# Patient Record
Sex: Female | Born: 1935
Health system: Southern US, Community
[De-identification: ages and names within clinical notes are randomized; demographics above are authoritative.]

## PROBLEM LIST (undated history)

## (undated) DIAGNOSIS — E7800 Pure hypercholesterolemia, unspecified: Secondary | ICD-10-CM

## (undated) DIAGNOSIS — I10 Essential (primary) hypertension: Secondary | ICD-10-CM

## (undated) DIAGNOSIS — M810 Age-related osteoporosis without current pathological fracture: Secondary | ICD-10-CM

## (undated) DIAGNOSIS — M1990 Unspecified osteoarthritis, unspecified site: Secondary | ICD-10-CM

## (undated) HISTORY — DX: Gilbert syndrome: E80.4

## (undated) HISTORY — PX: PARATHYROIDECTOMY: SHX19

---

## 2014-03-17 ENCOUNTER — Emergency Department (HOSPITAL_COMMUNITY)
Admission: EM | Admit: 2014-03-17 | Discharge: 2014-03-17 | Disposition: A | Discharge status: Home / Self Care | Payer: Medicare Other | Attending: Emergency Medicine | Admitting: Emergency Medicine

## 2014-03-17 ENCOUNTER — Encounter (HOSPITAL_COMMUNITY): Payer: Self-pay | Admitting: Emergency Medicine

## 2014-03-17 DIAGNOSIS — I10 Essential (primary) hypertension: Secondary | ICD-10-CM | POA: Diagnosis not present

## 2014-03-17 DIAGNOSIS — Z862 Personal history of diseases of the blood and blood-forming organs and certain disorders involving the immune mechanism: Secondary | ICD-10-CM | POA: Insufficient documentation

## 2014-03-17 DIAGNOSIS — Z8739 Personal history of other diseases of the musculoskeletal system and connective tissue: Secondary | ICD-10-CM | POA: Diagnosis not present

## 2014-03-17 DIAGNOSIS — I83893 Varicose veins of bilateral lower extremities with other complications: Secondary | ICD-10-CM | POA: Diagnosis not present

## 2014-03-17 DIAGNOSIS — Z8639 Personal history of other endocrine, nutritional and metabolic disease: Secondary | ICD-10-CM | POA: Insufficient documentation

## 2014-03-17 DIAGNOSIS — Z87891 Personal history of nicotine dependence: Secondary | ICD-10-CM | POA: Insufficient documentation

## 2014-03-17 DIAGNOSIS — I83892 Varicose veins of left lower extremities with other complications: Secondary | ICD-10-CM

## 2014-03-17 HISTORY — DX: Essential (primary) hypertension: I10

## 2014-03-17 HISTORY — DX: Unspecified osteoarthritis, unspecified site: M19.90

## 2014-03-17 HISTORY — DX: Age-related osteoporosis without current pathological fracture: M81.0

## 2014-03-17 HISTORY — DX: Pure hypercholesterolemia, unspecified: E78.00

## 2014-03-17 NOTE — ED Provider Notes (Signed)
Medical screening examination/treatment/procedure(s) were conducted as a shared visit with non-physician practitioner(s) and myself.  I personally evaluated the patient during the encounter.   EKG Interpretation None        Houston Siren III, MD 03/17/14 [phone]

## 2014-03-17 NOTE — ED Provider Notes (Signed)
Medical screening examination/treatment/procedure(s) were conducted as a shared visit with non-physician practitioner(s) and myself.  I personally evaluated the patient during the encounter.   EKG Interpretation None      79 yo female complaining of bleeding from her left thigh.  On exam, well appearing, nontoxic, not distressed, normal respiratory effort, normal perfusion, varicose vein to left lateral thigh without active bleeding, NV intact distally.  Appears well, dc'd home.  Clinical Impression: 1. Bleeding from varicose veins of left lower extremity       Houston Siren III, MD 03/17/14 [phone]

## 2014-03-17 NOTE — ED Notes (Signed)
Patient states was doing water aerobics this morning and she has a "superficial vein in upper L leg that started bleeding.  I couldn't get to stop, so I came here."   Bleeding is controlled at this time.   Gauze placed over area in case it starts bleeding again.

## 2014-03-17 NOTE — Discharge Instructions (Signed)
Followup with your primary care physician.  Bleeding Varicose Veins Varicose veins are veins that have become enlarged and twisted. Valves in the veins help return blood from the leg to the heart. If these valves are damaged, blood flows backwards and backs up into the veins in the leg near the skin. This causes the veins to become larger because of increased pressure within. Sometimes these veins bleed. CAUSES  Factors that can lead to bleeding varicose veins include:  Thinning of the skin that covers the veins. This skin is stretched as the veins enlarge.  Weak and thinning walls of the varicose veins. These thin walls are part of the reason why blood is not flowing normally to the heart.  Having high pressure in the veins. This high pressure occurs because the blood is not flowing freely back up to the heart.  Injury. Even a small injury to a varicose vein can cause bleeding.  Open wounds. A sore may develop near a varicose vein and not heal. This makes bleeding more likely.  Taking medicine that thins the blood. These medicines may include aspirin, anti-inflammatory medicine, and other blood thinners. SYMPTOMS  If bleeding is on the outside surface of the skin, blood can be seen. Sometimes, the bleeding stays under the skin. If this happens, the blue or purple area will spread beyond the vein. This discoloration may be visible. DIAGNOSIS  To decide if you have a bleeding varicose vein, your caregiver may:  Ask about your symptoms. This will include when you first saw bleeding.  Ask about how long you have had varicose veins and if they cause you problems.  Ask about your overall health.  Ask about possible causes, like recent cuts or if the area near the varicose veins was bumped or injured.  Examine the skin or leg that concerns you. Your caregiver will probably feel the veins.  Order imaging tests. These create detailed pictures of the veins. TREATMENT  The first goal of  treating bleeding varicose veins is to stop the bleeding. Then, the aim is to keep any bleeding from happening again. Treatment will depend on the cause of the bleeding and how bad it is. Ask your caregiver about what would be best for you. Options include:  Raising (elevating) your leg. Lie down with your leg propped up on a pillow or cushion. Your foot should be above your heart.  Applying pressure to the spot that is bleeding. The bleeding should stop in a short time.  Wearing elastic stocking that "compress" your legs (compression stockings). An elastic bandage may do the same thing.  Applying an antibiotic cream on sores that are not healing.  Surgically removing or closing off the bleeding varicose veins. HOME CARE INSTRUCTIONS   Apply any creams that your caregiver prescribed. Follow the directions carefully.  Wear compression stockings or any special wraps that were prescribed. Make sure you know:  If you should wear them every day.  How long you should wear them.  If veins were removed or closed, a bandage (dressing) will probably cover the area. Make sure you know:  How often the dressing should be changed.  Whether the area can get wet.  When you can leave the skin uncovered.  Check your skin every day. Look for new sores and signs of bleeding.  To prevent future bleeding:  Use extra care in situations where you could cut your legs. Shaving, for example, or working outside in the garden.  Try to keep your legs  elevated as much as possible. Lie down when you can. SEEK MEDICAL CARE IF:   You have any questions about how to wear compression stockings or elastic bandages.  Your veins continue to bleed.  Sores develop near your varicose veins.  You have a sore that does not heal or gets bigger.  Pain increases in your leg.  The area around a varicose vein becomes warm, red, or tender to the touch.  You notice a yellowish fluid that smells bad coming from a spot  where there was bleeding.  You develop a fever of more than 100.5 F (38.1 C). SEEK IMMEDIATE MEDICAL CARE IF:   You develop a fever of more than 102 F (38.9 C). Document Released: 12/08/2008 Document Revised: 10/14/2011 Document Reviewed: 11/23/2013 [facility] Patient Information 2015 [place], [place]. This information is not intended to replace advice given to you by your health care provider. Make sure you discuss any questions you have with your health care provider.

## 2014-03-17 NOTE — ED Provider Notes (Signed)
CSN: [id number]     Arrival date & time 03/17/14  1107 History   First MD Initiated Contact with Patient 03/17/14 1345     Chief Complaint  Patient presents with  . vein bleeding     patient states she cant get to stop.     (Consider location/radiation/quality/duration/timing/severity/associated sxs/prior Treatment) HPI Comments: Patient is a 53 show female with past medical history of hypertension, hypercholesterolemia, osteoporosis and arthritis who presents to the emergency department complaining of a bleeding vein in her left thigh beginning earlier this morning while she was doing water aerobics. Patient reports when she got out of the pool she noticed she had blood dripping down her leg. She tried to get the blood to stop, however was unable to do so. When she got home, she was still unable to get the blood to stop, however on arrival to the emergency department in triage, the bleeding stopped. Denies any pain, lightheadedness, weakness or dizziness. She is not sure she hit it. States she has a lot of "superficial veins" in her legs. She does not take any blood thinners.  The history is provided by the patient.    Past Medical History  Diagnosis Date  . Hypertension   . Hypercholesterolemia   . Osteoporosis   . Arthritis    Past Surgical History  Procedure Laterality Date  . Parathyroidectomy     No family history on file. History  Substance Use Topics  . Smoking status: Former Research scientist (life sciences)  . Smokeless tobacco: Not on file  . Alcohol Use: Yes   OB History   Grav Para Term Preterm Abortions TAB SAB Ect Mult Living                 Review of Systems  Skin: Positive for wound.  All other systems reviewed and are negative.     Allergies  Review of patient's allergies indicates no known allergies.  Home Medications   Prior to Admission medications   Not on File   BP 154/75  Pulse 75  Temp(Src) 97.7 F (36.5 C) (Oral)  Resp 24  Ht 5\' 5"  (1.651 m)  Wt 180 lb  (81.647 kg)  BMI 29.95 kg/m2  SpO2 97% Physical Exam  Nursing note and vitals reviewed. Constitutional: She is oriented to person, place, and time. She appears well-developed and well-nourished. No distress.  HENT:  Head: Normocephalic and atraumatic.  Mouth/Throat: Oropharynx is clear and moist.  Eyes: Conjunctivae are normal.  Neck: Normal range of motion. Neck supple.  Cardiovascular: Normal rate, regular rhythm and normal heart sounds.   Pulmonary/Chest: Effort normal and breath sounds normal.  Musculoskeletal: Normal range of motion. She exhibits no edema.  Neurological: She is alert and oriented to person, place, and time.  Skin: Skin is warm and dry. She is not diaphoretic.     Multiple superficial veins throughout bilateral legs.  Psychiatric: She has a normal mood and affect. Her behavior is normal.    ED Course  Procedures (including critical care time) Labs Review Labs Reviewed - No data to display  Imaging Review No results found.   EKG Interpretation None      MDM   Final diagnoses:  Bleeding from varicose veins of left lower extremity   Patient presenting with bleeding from superficial varicose vein on left leg. On arrival, no further bleeding. She is nontoxic appearing and in no apparent distress. Afebrile, vital signs stable. Between the waiting room, triage, my evaluation and evaluation by Dr. Doy Mince, patient  still without bleeding. Stable for discharge. F/u with PCP. Return precautions given. Patient states understanding of treatment care plan and is agreeable.  Case discussed with attending Dr. Doy Mince who also evaluated patient and agrees with plan of care.   Illene Labrador, PA-C 03/17/14 1454

## 2014-06-28 ENCOUNTER — Encounter: Payer: Self-pay | Admitting: Internal Medicine

## 2014-06-28 ENCOUNTER — Ambulatory Visit (INDEPENDENT_AMBULATORY_CARE_PROVIDER_SITE_OTHER): Payer: Medicare Other | Admitting: Internal Medicine

## 2014-06-28 VITALS — BP 144/62 | HR 88 | Resp 16 | Ht 64.75 in | Wt 186.0 lb

## 2014-06-28 DIAGNOSIS — I868 Varicose veins of other specified sites: Secondary | ICD-10-CM

## 2014-06-28 DIAGNOSIS — M1990 Unspecified osteoarthritis, unspecified site: Secondary | ICD-10-CM | POA: Insufficient documentation

## 2014-06-28 DIAGNOSIS — I10 Essential (primary) hypertension: Secondary | ICD-10-CM

## 2014-06-28 DIAGNOSIS — M8949 Other hypertrophic osteoarthropathy, multiple sites: Secondary | ICD-10-CM

## 2014-06-28 DIAGNOSIS — M8580 Other specified disorders of bone density and structure, unspecified site: Secondary | ICD-10-CM

## 2014-06-28 DIAGNOSIS — M150 Primary generalized (osteo)arthritis: Secondary | ICD-10-CM

## 2014-06-28 DIAGNOSIS — M159 Polyosteoarthritis, unspecified: Secondary | ICD-10-CM

## 2014-06-28 DIAGNOSIS — E785 Hyperlipidemia, unspecified: Secondary | ICD-10-CM | POA: Insufficient documentation

## 2014-06-28 DIAGNOSIS — I8390 Asymptomatic varicose veins of unspecified lower extremity: Secondary | ICD-10-CM

## 2014-06-28 DIAGNOSIS — Z23 Encounter for immunization: Secondary | ICD-10-CM

## 2014-06-28 DIAGNOSIS — K5730 Diverticulosis of large intestine without perforation or abscess without bleeding: Secondary | ICD-10-CM

## 2014-06-28 DIAGNOSIS — K635 Polyp of colon: Secondary | ICD-10-CM

## 2014-06-28 DIAGNOSIS — D351 Benign neoplasm of parathyroid gland: Secondary | ICD-10-CM

## 2014-06-28 LAB — CBC WITH DIFFERENTIAL/PLATELET
Basophils Absolute: 0 10*3/uL (ref 0.0–0.1)
Basophils Relative: 1 % (ref 0–1)
EOS ABS: 0.1 10*3/uL (ref 0.0–0.7)
Eosinophils Relative: 2 % (ref 0–5)
HCT: 44 % (ref 36.0–46.0)
Hemoglobin: 14.6 g/dL (ref 12.0–15.0)
LYMPHS ABS: 1.3 10*3/uL (ref 0.7–4.0)
LYMPHS PCT: 31 % (ref 12–46)
MCH: 33 pg (ref 26.0–34.0)
MCHC: 33.2 g/dL (ref 30.0–36.0)
MCV: 99.3 fL (ref 78.0–100.0)
MONO ABS: 0.3 10*3/uL (ref 0.1–1.0)
MPV: 10.9 fL (ref 9.4–12.4)
Monocytes Relative: 8 % (ref 3–12)
Neutro Abs: 2.4 10*3/uL (ref 1.7–7.7)
Neutrophils Relative %: 58 % (ref 43–77)
PLATELETS: 199 10*3/uL (ref 150–400)
RBC: 4.43 MIL/uL (ref 3.87–5.11)
RDW: 13.4 % (ref 11.5–15.5)
WBC: 4.1 10*3/uL (ref 4.0–10.5)

## 2014-06-28 LAB — COMPREHENSIVE METABOLIC PANEL
ALBUMIN: 4.7 g/dL (ref 3.5–5.2)
ALT: 20 U/L (ref 0–35)
AST: 24 U/L (ref 0–37)
Alkaline Phosphatase: 61 U/L (ref 39–117)
BUN: 20 mg/dL (ref 6–23)
CALCIUM: 10 mg/dL (ref 8.4–10.5)
CHLORIDE: 100 meq/L (ref 96–112)
CO2: 29 mEq/L (ref 19–32)
CREATININE: 0.86 mg/dL (ref 0.50–1.10)
GLUCOSE: 90 mg/dL (ref 70–99)
POTASSIUM: 4.5 meq/L (ref 3.5–5.3)
Sodium: 139 mEq/L (ref 135–145)
Total Bilirubin: 1.6 mg/dL — ABNORMAL HIGH (ref 0.2–1.2)
Total Protein: 7.1 g/dL (ref 6.0–8.3)

## 2014-06-28 LAB — LIPID PANEL
CHOL/HDL RATIO: 2.4 ratio
CHOLESTEROL: 168 mg/dL (ref 0–200)
HDL: 71 mg/dL (ref >39)
LDL Cholesterol: 83 mg/dL (ref 0–99)
TRIGLYCERIDES: 68 mg/dL (ref <150)
VLDL: 14 mg/dL (ref 0–40)

## 2014-06-28 LAB — TSH: TSH: 1.724 u[IU]/mL (ref 0.350–4.500)

## 2014-06-28 NOTE — Progress Notes (Signed)
Subjective:    Patient ID: Holly Larson, female    DOB: 06/16/1935, 79 y.o.   MRN: [id number]  HPI New pt here for first visit.  Recently moved to [place] from [place] to be near daughter and grandchildren.   Goes by  Holly Larson  PMH of DJD , HTN approz 5 years,  Hyperlipidemia on statin,  Gilberts syndrome, osteopenia  (formerly osteroporosis Tx with Fosamax over 5 years),  Colon polyp ,  Diverticulosis, And venous varicosities (seen by [facility] on [place]).  She is S/P remote paratahyroidectomy for parathyroid adenoma.   She will be travelling to [place] and needs update for TDAP  Was seen in ER with bleeding varicose vein of left thigh.  [facility]   She also has a hemmorrhoid and would like to see a GI MD to discuss options for removal.  No blood now.     No Known Allergies Past Medical History  Diagnosis Date  . Hypertension   . Hypercholesterolemia   . Osteoporosis   . Arthritis   . Gilbert's syndrome    Past Surgical History  Procedure Laterality Date  . Parathyroidectomy     History   Social History  . Marital Status: Married    Spouse Name: N/A    Number of Children: N/A  . Years of Education: N/A   Occupational History  . Not on file.   Social History Main Topics  . Smoking status: Former Smoker -- 1.00 packs/day for 20 years    Types: Cigarettes    Quit date: 08/05/1973  . Smokeless tobacco: Never Used  . Alcohol Use: 8.4 oz/week    14 Glasses of wine per week  . Drug Use: No  . Sexual Activity:    Partners: Male   Other Topics Concern  . Not on file   Social History Narrative   Family History  Problem Relation Age of Onset  . Arthritis Mother   . Macular degeneration Mother   . Diabetes Father   . Glaucoma Father   . Congestive Heart Failure Father    Patient Active Problem List   Diagnosis Date Noted  . HTN (hypertension) 06/28/2014  . Hyperlipidemia 06/28/2014  . DJD (degenerative joint disease)  06/28/2014  . Osteopenia 06/28/2014  . Varicose veins 06/28/2014  . Parathyroid adenoma  S/P parathyroidectomy adenoma 06/28/2014   No current outpatient prescriptions on file prior to visit.   No current facility-administered medications on file prior to visit.       Review of Systems See HPI    Objective:   Physical Exam Physical Exam  Nursing note and vitals reviewed.  Constitutional: She is oriented to person, place, and time. She appears well-developed and well-nourished.  HENT:  Head: Normocephalic and atraumatic.  Cardiovascular: Normal rate and regular rhythm. Exam reveals no gallop and no friction rub.  No murmur heard.  Pulmonary/Chest: Breath sounds normal. She has no wheezes. She has no rales.  Neurological: She is alert and oriented to person, place, and time.  Skin: Skin is warm and dry.  Psychiatric: She has a normal mood and affect. Her behavior is normal.              Assessment & Plan:  HTN  Continue current meds.  Check alll labs today   Hyperlipidemia continue Zocor  Check lipid/liver  OSteopenia  Had been on Fosamax for many years and now has stopped  DJD primarily hands  S/P parathyroidectomy for  adenoma  Hemmorrhoid  Ok to give number to Dr. Earlie Raveling to discuss treatment options  Venous varicosities  Managed by vascular   Wil give Tdap today

## 2014-06-28 NOTE — Patient Instructions (Signed)
To lab today  Schedule CPE next year

## 2014-06-29 DIAGNOSIS — K635 Polyp of colon: Secondary | ICD-10-CM | POA: Insufficient documentation

## 2014-06-29 DIAGNOSIS — K5730 Diverticulosis of large intestine without perforation or abscess without bleeding: Secondary | ICD-10-CM | POA: Insufficient documentation

## 2014-06-29 LAB — VITAMIN D 25 HYDROXY (VIT D DEFICIENCY, FRACTURES): Vit D, 25-Hydroxy: 32 ng/mL (ref 30–100)

## 2014-07-15 ENCOUNTER — Ambulatory Visit (INDEPENDENT_AMBULATORY_CARE_PROVIDER_SITE_OTHER): Payer: Medicare Other | Admitting: Internal Medicine

## 2014-07-15 DIAGNOSIS — Z23 Encounter for immunization: Secondary | ICD-10-CM

## 2014-07-15 MED ORDER — CIPROFLOXACIN HCL 500 MG PO TABS: 500.0000 mg | ORAL_TABLET | Freq: Two times a day (BID) | ORAL | Status: DC

## 2014-07-15 NOTE — Progress Notes (Signed)
Subjective:   Holly Larson is a 79 y.o. female who presents to the Infectious Disease clinic for travel consultation. Planned departure date: January 16th          Planned return date: 2 weeks Countries of travel: [place] Areas in country: urban   Accommodations: hotel Purpose of travel: vacation Prior travel out of [place]: yes     Objective:   Scheduled Meds: Continuous Infusions: PRN Meds:.   Medications:reviewed   Assessment:    No contraindications to travel. none     Plan:    Issues discussed: environmental concerns, future shots, insect-borne illnesses, malaria, MVA safety, rabies, safe food/water, traveler's diarrhea, website/handouts for more information, what to do if ill upon return and what to do if ill while there. Immunizations recommended: Hepatitis A series and Typhoid (parenteral). Malaria prophylaxis: not indicated Traveler's diarrhea prophylaxis: ciprofloxacin.discussed risks including C diff Total duration of visit: 1 Hour. Total time spent on education, counseling, coordination of care: 30 Minutes.

## 2014-08-18 DIAGNOSIS — H16223 Keratoconjunctivitis sicca, not specified as Sjogren's, bilateral: Secondary | ICD-10-CM | POA: Diagnosis not present

## 2014-08-18 DIAGNOSIS — H04123 Dry eye syndrome of bilateral lacrimal glands: Secondary | ICD-10-CM | POA: Diagnosis not present

## 2014-09-13 ENCOUNTER — Telehealth: Payer: Self-pay | Admitting: Internal Medicine

## 2014-09-13 NOTE — Telephone Encounter (Signed)
Patient has had cold for two weeks now and cannot get rid of cough.  She wanted to know if we could call in Rx for Albuterol which has helped her in the past.

## 2014-09-14 ENCOUNTER — Ambulatory Visit (INDEPENDENT_AMBULATORY_CARE_PROVIDER_SITE_OTHER): Payer: Medicare Other | Admitting: Internal Medicine

## 2014-09-14 ENCOUNTER — Encounter: Payer: Self-pay | Admitting: Internal Medicine

## 2014-09-14 VITALS — BP 135/68 | HR 64 | Resp 16 | Ht 65.0 in | Wt 187.0 lb

## 2014-09-14 DIAGNOSIS — R059 Cough, unspecified: Secondary | ICD-10-CM

## 2014-09-14 DIAGNOSIS — R05 Cough: Secondary | ICD-10-CM

## 2014-09-14 MED ORDER — AZITHROMYCIN 250 MG PO TABS: ORAL_TABLET | ORAL | Status: DC

## 2014-09-14 MED ORDER — METHYLPREDNISOLONE ACETATE 80 MG/ML IJ SUSP
80.0000 mg | Freq: Once | INTRAMUSCULAR | Status: AC
  Administered 2014-09-14: 80 mg via INTRAMUSCULAR

## 2014-09-14 MED ORDER — HYDROCODONE-HOMATROPINE 5-1.5 MG/5ML PO SYRP: 5.0000 mL | ORAL_SOLUTION | Freq: Three times a day (TID) | ORAL | Status: DC | PRN

## 2014-09-14 NOTE — Progress Notes (Signed)
Subjective:    Patient ID: Holly Larson, female    DOB: 05/13/1935, 80 y.o.   MRN: [id number]  HPI  07/2014  ID Travel clinic note Plan:   Issues discussed: environmental concerns, future shots, insect-borne illnesses, malaria, MVA safety, rabies, safe food/water, traveler's diarrhea, website/handouts for more information, what to do if ill upon return and what to do if ill while there. Immunizations recommended: Hepatitis A series and Typhoid (parenteral). Malaria prophylaxis: not indicated Traveler's diarrhea prophylaxis: ciprofloxacin.discussed risks including C diff Total duration of visit: 1 Hour. Total time spent on education, counseling, coordination of care: 30 Minutes.         TODAY  Prolonged cough since end of January.  Came back from [place] 1/29 and had head cold with cough . Cough now productive of yellow mucous.  NO fever no chest pain no sob     No Known Allergies Past Medical History  Diagnosis Date  . Hypertension   . Hypercholesterolemia   . Osteoporosis   . Arthritis   . Gilbert's syndrome    Past Surgical History  Procedure Laterality Date  . Parathyroidectomy     History   Social History  . Marital Status: Married    Spouse Name: N/A  . Number of Children: N/A  . Years of Education: N/A   Occupational History  . Not on file.   Social History Main Topics  . Smoking status: Former Smoker -- 1.00 packs/day for 20 years    Types: Cigarettes    Quit date: 08/05/1973  . Smokeless tobacco: Never Used  . Alcohol Use: 8.4 oz/week    14 Glasses of wine per week  . Drug Use: No  . Sexual Activity:    Partners: Male   Other Topics Concern  . Not on file   Social History Narrative   Family History  Problem Relation Age of Onset  . Arthritis Mother   . Macular degeneration Mother   . Diabetes Father   . Glaucoma Father   . Congestive Heart Failure Father    Patient Active Problem List   Diagnosis Date Noted  . Colon polyp  06/29/2014  . Gilbert's syndrome 06/29/2014  . Diverticulosis of colon without hemorrhage 06/29/2014  . HTN (hypertension) 06/28/2014  . Hyperlipidemia 06/28/2014  . DJD (degenerative joint disease) 06/28/2014  . Osteopenia 06/28/2014  . Varicose veins 06/28/2014  . Parathyroid adenoma  S/P parathyroidectomy adenoma 06/28/2014   Current Outpatient Prescriptions on File Prior to Visit  Medication Sig Dispense Refill  . ciprofloxacin (CIPRO) 500 MG tablet Take 1 tablet (500 mg total) by mouth 2 (two) times daily. Take 2-3 days until diarrhea resolves 6 tablet 0  . hydrochlorothiazide (HYDRODIURIL) 12.5 MG tablet Take 12.5 mg by mouth daily.    . simvastatin (ZOCOR) 20 MG tablet Take 20 mg by mouth daily.     No current facility-administered medications on file prior to visit.      Review of Systems See HPI  Objective:   Physical Exam  Physical Exam  Nursing note and vitals reviewed.  Constitutional: She is oriented to person, place, and time. She appears well-developed and well-nourished.  HENT:  Head: Normocephalic and atraumatic.  Cardiovascular: Normal rate and regular rhythm. Exam reveals no gallop and no friction rub.  No murmur heard.  Pulmonary/Chest: Breath sounds normal. Few end exp wheezes . She has no rales.  Neurological: She is alert and oriented to person, place, and time.  Skin: Skin is warm and dry.  Psychiatric: She has a normal mood and affect. Her behavior is normal.             Assessment & Plan:  Mild bronchospasm :  Will give depo-medrol 80 mg IM in office  Bronchitis  z-pak  Cough  Hycodan    See me if not better

## 2014-09-14 NOTE — Patient Instructions (Signed)
See me as needed 

## 2014-09-19 DIAGNOSIS — H16223 Keratoconjunctivitis sicca, not specified as Sjogren's, bilateral: Secondary | ICD-10-CM | POA: Diagnosis not present

## 2014-10-03 ENCOUNTER — Other Ambulatory Visit: Payer: Self-pay | Admitting: *Deleted

## 2014-10-03 MED ORDER — HYDROCODONE-HOMATROPINE 5-1.5 MG/5ML PO SYRP: 5.0000 mL | ORAL_SOLUTION | Freq: Three times a day (TID) | ORAL | Status: DC | PRN

## 2014-10-03 NOTE — Telephone Encounter (Signed)
RX printed for patient to P/U

## 2014-10-03 NOTE — Telephone Encounter (Signed)
Holly Larson is requesting a refill on the cough medication. It helps her sleep at night with her cough

## 2014-10-27 ENCOUNTER — Ambulatory Visit: Payer: Medicare Other | Admitting: Internal Medicine

## 2014-11-10 ENCOUNTER — Other Ambulatory Visit: Payer: Self-pay | Admitting: *Deleted

## 2014-11-10 NOTE — Telephone Encounter (Signed)
If her cough is not better she needs to have a CXR and to see me on MOnday.  She may need prednisone  OK to order a CXR on Friday She can have it done in [place] if she wishes as she lives there

## 2014-11-10 NOTE — Telephone Encounter (Signed)
Refill request- She said that her cough is not getting better

## 2014-11-11 NOTE — Telephone Encounter (Signed)
I spoke with Holly Larson and she is going to come in on Tuesday to follow up on her cough

## 2014-11-15 ENCOUNTER — Encounter: Payer: Self-pay | Admitting: Internal Medicine

## 2014-11-15 ENCOUNTER — Ambulatory Visit (INDEPENDENT_AMBULATORY_CARE_PROVIDER_SITE_OTHER): Payer: Medicare Other | Admitting: Internal Medicine

## 2014-11-15 ENCOUNTER — Other Ambulatory Visit (HOSPITAL_COMMUNITY)
Admission: RE | Admit: 2014-11-15 | Discharge: 2014-11-15 | Disposition: A | Discharge status: Home / Self Care | Payer: Medicare Other | Source: Ambulatory Visit | Attending: Internal Medicine | Admitting: Internal Medicine

## 2014-11-15 ENCOUNTER — Other Ambulatory Visit: Payer: Self-pay | Admitting: Internal Medicine

## 2014-11-15 ENCOUNTER — Telehealth: Payer: Self-pay | Admitting: Internal Medicine

## 2014-11-15 ENCOUNTER — Ambulatory Visit (HOSPITAL_BASED_OUTPATIENT_CLINIC_OR_DEPARTMENT_OTHER)
Admission: RE | Admit: 2014-11-15 | Discharge: 2014-11-15 | Disposition: A | Discharge status: Home / Self Care | Payer: Medicare Other | Source: Ambulatory Visit | Attending: Internal Medicine | Admitting: Internal Medicine

## 2014-11-15 VITALS — BP 140/76 | HR 80 | Resp 16 | Ht 64.5 in | Wt 187.0 lb

## 2014-11-15 DIAGNOSIS — Z87891 Personal history of nicotine dependence: Secondary | ICD-10-CM | POA: Diagnosis not present

## 2014-11-15 DIAGNOSIS — R05 Cough: Secondary | ICD-10-CM | POA: Diagnosis not present

## 2014-11-15 DIAGNOSIS — Z124 Encounter for screening for malignant neoplasm of cervix: Secondary | ICD-10-CM

## 2014-11-15 DIAGNOSIS — Z1151 Encounter for screening for human papillomavirus (HPV): Secondary | ICD-10-CM

## 2014-11-15 DIAGNOSIS — Z0000 Encounter for general adult medical examination without abnormal findings: Secondary | ICD-10-CM | POA: Diagnosis not present

## 2014-11-15 DIAGNOSIS — Z1211 Encounter for screening for malignant neoplasm of colon: Secondary | ICD-10-CM

## 2014-11-15 DIAGNOSIS — R059 Cough, unspecified: Secondary | ICD-10-CM

## 2014-11-15 DIAGNOSIS — I10 Essential (primary) hypertension: Secondary | ICD-10-CM

## 2014-11-15 DIAGNOSIS — R928 Other abnormal and inconclusive findings on diagnostic imaging of breast: Secondary | ICD-10-CM | POA: Diagnosis not present

## 2014-11-15 DIAGNOSIS — Z1231 Encounter for screening mammogram for malignant neoplasm of breast: Secondary | ICD-10-CM

## 2014-11-15 LAB — POCT URINALYSIS DIPSTICK
Bilirubin, UA: NEGATIVE
GLUCOSE UA: NEGATIVE
Ketones, UA: NEGATIVE
LEUKOCYTES UA: NEGATIVE
NITRITE UA: NEGATIVE
Protein, UA: NEGATIVE
RBC UA: NEGATIVE
Spec Grav, UA: 1.01
Urobilinogen, UA: NEGATIVE
pH, UA: 6.5

## 2014-11-15 LAB — HEMOCCULT GUIAC POC 1CARD (OFFICE)
FECAL OCCULT BLD: NEGATIVE
OCCULT BLOOD DATE: 4122016

## 2014-11-15 MED ORDER — PREDNISONE 20 MG PO TABS: ORAL_TABLET | ORAL | Status: AC

## 2014-11-15 MED ORDER — HYDROCHLOROTHIAZIDE 12.5 MG PO TABS: 12.5000 mg | ORAL_TABLET | Freq: Every day | ORAL | Status: AC

## 2014-11-15 MED ORDER — ALBUTEROL SULFATE HFA 108 (90 BASE) MCG/ACT IN AERS
INHALATION_SPRAY | RESPIRATORY_TRACT | Status: DC
Start: 2014-11-15 — End: 2023-01-21

## 2014-11-15 MED ORDER — SIMVASTATIN 20 MG PO TABS: 20.0000 mg | ORAL_TABLET | Freq: Every day | ORAL | Status: AC

## 2014-11-15 NOTE — Telephone Encounter (Signed)
I spoke with Hudson about her EKG

## 2014-11-15 NOTE — Progress Notes (Addendum)
Subjective:    Patient ID: Holly Larson, female    DOB: 1935-05-02, 80 y.o.   MRN: [id number]  HPI  06/2014 note Assessment & Plan:  HTN Continue current meds. Check alll labs today   Hyperlipidemia continue Zocor Check lipid/liver  OSteopenia Had been on Fosamax for many years and now has stopped  DJD primarily hands  S/P parathyroidectomy for adenoma  Hemmorrhoid Ok to give number to Dr. Earlie Raveling to discuss treatment options  Venous varicosities Managed by vascular   Wil give Tdap today        Today   Holly Larson is here for CPE  HM: needs mm,    Needs pap today   15 pack year smoker   Quit 1975  Doing well travelled to [place] .  Had dry cough in January some improvement while travelling but now has been persistant.  Denies wheezing.  Had similar episode in past that responded welll to Albuterol MDI  No chest pain,  No sob,  Cough dry no fever   No Known Allergies Past Medical History  Diagnosis Date  . Hypertension   . Hypercholesterolemia   . Osteoporosis   . Arthritis   . Gilbert's syndrome    Past Surgical History  Procedure Laterality Date  . Parathyroidectomy     History   Social History  . Marital Status: Married    Spouse Name: N/A  . Number of Children: N/A  . Years of Education: N/A   Occupational History  . Not on file.   Social History Main Topics  . Smoking status: Former Smoker -- 1.00 packs/day for 20 years    Types: Cigarettes    Quit date: 08/05/1973  . Smokeless tobacco: Never Used  . Alcohol Use: 8.4 oz/week    14 Glasses of wine per week  . Drug Use: No  . Sexual Activity:    Partners: Male   Other Topics Concern  . Not on file   Social History Narrative   Family History  Problem Relation Age of Onset  . Arthritis Mother   . Macular degeneration Mother   . Diabetes Father   . Glaucoma Father   . Congestive Heart Failure Father    Patient Active Problem List   Diagnosis Date Noted  . Colon  polyp 06/29/2014  . Gilbert's syndrome 06/29/2014  . Diverticulosis of colon without hemorrhage 06/29/2014  . HTN (hypertension) 06/28/2014  . Hyperlipidemia 06/28/2014  . DJD (degenerative joint disease) 06/28/2014  . Osteopenia 06/28/2014  . Varicose veins 06/28/2014  . Parathyroid adenoma  S/P parathyroidectomy adenoma 06/28/2014   Current Outpatient Prescriptions on File Prior to Visit  Medication Sig Dispense Refill  . azithromycin (ZITHROMAX) 250 MG tablet Take as directed 6 tablet 0  . hydrochlorothiazide (HYDRODIURIL) 12.5 MG tablet Take 12.5 mg by mouth daily.    Marland Kitchen HYDROcodone-homatropine (HYCODAN) 5-1.5 MG/5ML syrup Take 5 mLs by mouth every 8 (eight) hours as needed for cough. 120 mL 0  . simvastatin (ZOCOR) 20 MG tablet Take 20 mg by mouth daily.     No current facility-administered medications on file prior to visit.      Review of Systems See HPI    Objective:   Physical Exam Physical Exam  Vital signs and nursing note reviewed  Constitutional: She is oriented to person, place, and time. She appears well-developed and well-nourished. She is cooperative.  HENT:  Head: Normocephalic and atraumatic.  Right Ear: Tympanic membrane normal.  Left Ear: Tympanic membrane normal.  Nose: Nose normal.  Mouth/Throat: Oropharynx is clear and moist and mucous membranes are normal. No oropharyngeal exudate or posterior oropharyngeal erythema.  Eyes: Conjunctivae and EOM are normal. Pupils are equal, round, and reactive to light.  Neck: Neck supple. No JVD present. Carotid bruit is not present. No mass and no thyromegaly present.  Cardiovascular: Regular rhythm, normal heart sounds, intact distal pulses and normal pulses.  Exam reveals no gallop and no friction rub.   No murmur heard. Pulses:      Dorsalis pedis pulses are 2+ on the right side, and 2+ on the left side.  Pulmonary/Chest: Breath sounds normal. She has no wheezes. She has no rhonchi. She has no rales. Right  breast exhibits no mass, no nipple discharge and no skin change. Left breast exhibits no mass, no nipple discharge and no skin change.  Abdominal: Soft. Bowel sounds are normal. She exhibits no distension and no mass. There is no hepatosplenomegaly. There is no tenderness. There is no CVA tenderness.  Genitourinary: Rectum normal, vagina normal and uterus normal. Rectal exam shows no mass. Guaiac negative stool. No labial fusion. There is no lesion on the right labia. There is no lesion on the left labia. Cervix exhibits no motion tenderness. Right adnexum displays no mass, no tenderness and no fullness. Left adnexum displays no mass, no tenderness and no fullness. No erythema around the vagina.  Musculoskeletal:       No active synovitis to any joint.    Lymphadenopathy:       Right cervical: No superficial cervical adenopathy present.      Left cervical: No superficial cervical adenopathy present.       Right axillary: No pectoral and no lateral adenopathy present.       Left axillary: No pectoral and no lateral adenopathy present.      Right: No inguinal adenopathy present.       Left: No inguinal adenopathy present.  Neurological: She is alert and oriented to person, place, and time. She has normal strength and normal reflexes. No cranial nerve deficit or sensory deficit. She displays a negative Romberg sign. Coordination and gait normal.  Skin: Skin is warm and dry. No abrasion, no bruising, no ecchymosis and no rash noted. No cyanosis. Nails show no clubbing.  Psychiatric: She has a normal mood and affect. Her speech is normal and behavior is normal.          Assessment & Plan:  HM  Pap today  Will schedule 3 D mm  Not a candidate for lung CA screening  Chronic cough  Will get CXR  RX short course of prednisone 40 mg taper  And albuterol MDI for next 3-4 weeks  HTN continue meds  Hyperlipidemia  Continue meds  Chronic increased bilirubin  H/O Gilbert's syndrome   Osteopenia   Will need Dexa in the fall     Colon polyp/diverticulosis need repeat colonoscopy 2019  Pt informed of my departure and given letter with alternataive PCP options   Addendum  I reviewed EKG with Dr. Luther Parody cardiology  He feels pt is in NSR and not a fib.  Addendum 12/20/2014  Spoke with pt regarding Breast BX results explained pathology of fibrocystic changes with columnar cells and advised pt to have repeat mammogram and ultrasound in 6 months per radiology recommendations Also advised to establish care and make appointment  with PCP of choice.  She voices understanding and states she will make appt      Assessment &  Plan:

## 2014-11-15 NOTE — Addendum Note (Signed)
Addended by: Emi Belfast D on: 11/15/2014 02:28 PM   Modules accepted: Orders

## 2014-11-15 NOTE — Telephone Encounter (Signed)
Holly Larson  Call pt and let her know that I spoke with cardiologist who felt that Rhythm on her EKG was ok and not a problem

## 2014-11-16 ENCOUNTER — Telehealth: Payer: Self-pay | Admitting: *Deleted

## 2014-11-16 ENCOUNTER — Encounter: Payer: Self-pay | Admitting: *Deleted

## 2014-11-16 NOTE — Telephone Encounter (Signed)
I spoke with pt in regards to her CXR-eh

## 2014-11-16 NOTE — Telephone Encounter (Signed)
-----   Message from Lanice Shirts, MD sent at 11/16/2014  7:20 AM EDT ----- Call pt and let her know her CXR is normal.   Use prednisone and albuterol as prescribed.  Call if further concerns over her cough

## 2014-11-17 ENCOUNTER — Ambulatory Visit: Payer: Medicare Other

## 2014-11-17 LAB — CYTOLOGY - PAP

## 2014-11-22 ENCOUNTER — Ambulatory Visit (INDEPENDENT_AMBULATORY_CARE_PROVIDER_SITE_OTHER): Payer: Medicare Other | Admitting: *Deleted

## 2014-11-22 DIAGNOSIS — Z23 Encounter for immunization: Secondary | ICD-10-CM | POA: Diagnosis not present

## 2014-11-22 DIAGNOSIS — H16223 Keratoconjunctivitis sicca, not specified as Sjogren's, bilateral: Secondary | ICD-10-CM | POA: Diagnosis not present

## 2014-11-25 ENCOUNTER — Ambulatory Visit
Admission: RE | Admit: 2014-11-25 | Discharge: 2014-11-25 | Disposition: A | Discharge status: Home / Self Care | Payer: Medicare Other | Source: Ambulatory Visit | Attending: Internal Medicine | Admitting: Internal Medicine

## 2014-11-25 DIAGNOSIS — Z1231 Encounter for screening mammogram for malignant neoplasm of breast: Secondary | ICD-10-CM | POA: Diagnosis not present

## 2014-11-28 ENCOUNTER — Other Ambulatory Visit: Payer: Self-pay | Admitting: Internal Medicine

## 2014-11-28 DIAGNOSIS — R928 Other abnormal and inconclusive findings on diagnostic imaging of breast: Secondary | ICD-10-CM

## 2014-12-09 ENCOUNTER — Ambulatory Visit
Admission: RE | Admit: 2014-12-09 | Discharge: 2014-12-09 | Disposition: A | Discharge status: Home / Self Care | Payer: Medicare Other | Source: Ambulatory Visit | Attending: Internal Medicine | Admitting: Internal Medicine

## 2014-12-09 ENCOUNTER — Other Ambulatory Visit: Payer: Self-pay | Admitting: Internal Medicine

## 2014-12-09 DIAGNOSIS — R928 Other abnormal and inconclusive findings on diagnostic imaging of breast: Secondary | ICD-10-CM

## 2014-12-09 DIAGNOSIS — N6002 Solitary cyst of left breast: Secondary | ICD-10-CM | POA: Diagnosis not present

## 2014-12-15 ENCOUNTER — Ambulatory Visit
Admission: RE | Admit: 2014-12-15 | Discharge: 2014-12-15 | Disposition: A | Discharge status: Home / Self Care | Payer: Medicare Other | Source: Ambulatory Visit | Attending: Internal Medicine | Admitting: Internal Medicine

## 2014-12-15 DIAGNOSIS — N6012 Diffuse cystic mastopathy of left breast: Secondary | ICD-10-CM | POA: Diagnosis not present

## 2014-12-15 DIAGNOSIS — N63 Unspecified lump in breast: Secondary | ICD-10-CM | POA: Diagnosis not present

## 2014-12-15 DIAGNOSIS — R928 Other abnormal and inconclusive findings on diagnostic imaging of breast: Secondary | ICD-10-CM

## 2014-12-15 HISTORY — PX: BREAST BIOPSY: SHX20

## 2014-12-20 DIAGNOSIS — R928 Other abnormal and inconclusive findings on diagnostic imaging of breast: Secondary | ICD-10-CM | POA: Insufficient documentation

## 2015-03-28 DIAGNOSIS — H16223 Keratoconjunctivitis sicca, not specified as Sjogren's, bilateral: Secondary | ICD-10-CM | POA: Diagnosis not present

## 2015-07-17 DIAGNOSIS — Z23 Encounter for immunization: Secondary | ICD-10-CM | POA: Diagnosis not present

## 2015-07-21 DIAGNOSIS — H04123 Dry eye syndrome of bilateral lacrimal glands: Secondary | ICD-10-CM | POA: Diagnosis not present

## 2015-07-21 DIAGNOSIS — H16223 Keratoconjunctivitis sicca, not specified as Sjogren's, bilateral: Secondary | ICD-10-CM | POA: Diagnosis not present

## 2015-07-21 DIAGNOSIS — H25013 Cortical age-related cataract, bilateral: Secondary | ICD-10-CM | POA: Diagnosis not present

## 2015-09-04 ENCOUNTER — Other Ambulatory Visit: Payer: Self-pay | Admitting: Internal Medicine

## 2015-09-04 DIAGNOSIS — N6320 Unspecified lump in the left breast, unspecified quadrant: Secondary | ICD-10-CM

## 2015-09-06 ENCOUNTER — Ambulatory Visit
Admission: RE | Admit: 2015-09-06 | Discharge: 2015-09-06 | Disposition: A | Discharge status: Home / Self Care | Payer: Medicare Other | Source: Ambulatory Visit | Attending: Internal Medicine | Admitting: Internal Medicine

## 2015-09-06 DIAGNOSIS — N6320 Unspecified lump in the left breast, unspecified quadrant: Secondary | ICD-10-CM

## 2015-12-12 ENCOUNTER — Other Ambulatory Visit: Payer: Self-pay | Admitting: Internal Medicine

## 2015-12-12 DIAGNOSIS — M8580 Other specified disorders of bone density and structure, unspecified site: Secondary | ICD-10-CM

## 2015-12-12 DIAGNOSIS — Z1231 Encounter for screening mammogram for malignant neoplasm of breast: Secondary | ICD-10-CM

## 2016-01-08 ENCOUNTER — Ambulatory Visit
Admission: RE | Admit: 2016-01-08 | Discharge: 2016-01-08 | Disposition: A | Discharge status: Home / Self Care | Payer: Medicare Other | Source: Ambulatory Visit | Attending: Internal Medicine | Admitting: Internal Medicine

## 2016-01-08 DIAGNOSIS — M8580 Other specified disorders of bone density and structure, unspecified site: Secondary | ICD-10-CM

## 2016-01-08 DIAGNOSIS — Z1231 Encounter for screening mammogram for malignant neoplasm of breast: Secondary | ICD-10-CM

## 2016-01-31 ENCOUNTER — Ambulatory Visit: Payer: Medicare Other | Attending: Internal Medicine

## 2016-01-31 DIAGNOSIS — R293 Abnormal posture: Secondary | ICD-10-CM | POA: Diagnosis present

## 2016-01-31 DIAGNOSIS — M6281 Muscle weakness (generalized): Secondary | ICD-10-CM | POA: Insufficient documentation

## 2016-01-31 NOTE — Patient Instructions (Signed)
Resisted Horizontal Abduction: Bilateral   Sit or stand, tubing in both hands, arms out in front. Keeping arms straight, pinch shoulder blades together and stretch arms out. Repeat _10___ times per set. Do 2____ sets per session. Do _1-2___ sessions per day.   Scapular Retraction: Elbow Flexion (Standing)   With elbows bent to 90, pinch shoulder blades together and rotate arms out, keeping elbows bent. Repeat _10___ times per set. Do _1___ sets per session. Do many____ sessions per day.    KEEP HEAD IN NEUTRAL AND SHOULDERS DOWN AND RELAXED   Hold tubing in right hand, arm forward. Pull arm back, elbow straight. Repeat __10__ times per set. Do __2__ sets per session. Do _1-2___ sessions per day.  Copyright  VHI. All rights reserved.     With resistive band anchored in door, grasp both ends. Keeping elbows bent, pull back, squeezing shoulder blades together. Hold _3__ seconds. Repeat _2x10___ times. Do _1-2___ sessions per day.  http://[email or web address].exer.[email or web address]    Posture - Standing   Good posture is important. Avoid slouching and forward head thrust. Maintain curve in low back and align ears over shoulders, hips over ankles.  Pull your belly button in toward your back bone. Posture Tips DO: - stand tall and erect - keep chin tucked in - keep head and shoulders in alignment - check posture regularly in mirror or large window - pull head back against headrest in car seat;  Change your position often.  Sit with lumbar support. DON'T: - slouch or slump while watching TV or reading - sit, stand or lie in one position  for too long;  Sitting is especially hard on the spine so if you sit at a desk/use the computer, then stand up often! Copyright  VHI. All rights reserved.  Posture - Sitting  Sit upright, head facing forward. Try using a roll to support lower back. Keep shoulders relaxed, and avoid rounded back. Keep hips level with knees. Avoid crossing legs for long periods. Copyright   VHI. All rights reserved.  Chronic neck strain can develop because of poor posture and faulty work habits  Postural strain related to slumped sitting and forward head posture is a leading cause of headaches, neck and upper back pain  General strengthening and flexibility exercises are helpful in the treatment of neck pain.  Most importantly, you should learn to correct the posture that may be contributing to chronic pain.   Change positions frequently  Change your work or home environment to improve posture and mechanics.      Lifting Principles  .Maintain proper posture and head alignment. .Slide object as close as possible before lifting. .Move obstacles out of the way. .Test before lifting; ask for help if too heavy. .Tighten stomach muscles without holding breath. .Use smooth movements; do not jerk. .Use legs to do the work, and pivot with feet. .Distribute the work load symmetrically and close to the center of trunk. .Push instead of pull whenever possible.   Squat down and hold basket close to stand. Use leg muscles to do the work.    Avoid twisting or bending back. Pivot around using foot movements, and bend at knees if needed when reaching for articles.        Getting Into / Out of Bed   Lower self to lie down on one side by raising legs and lowering head at the same time. Use arms to assist moving without twisting. Bend both knees to roll onto back if desired. To sit  up, start from lying on side, and use same move-ments in reverse. Keep trunk aligned with legs.    Shift weight from front foot to back foot as item is lifted off shelf.    When leaning forward to pick object up from floor, extend one leg out behind. Keep back straight. Hold onto a sturdy support with other hand.      Sit upright, head facing forward. Try using a roll to support lower back. Keep shoulders relaxed, and avoid rounded back. Keep hips level with knees. Avoid crossing legs  for long periods.    [facility] [address] [place], [place] [address] Phone # [phone] Fax [phone]

## 2016-01-31 NOTE — Therapy (Addendum)
[facility] [address], [place], [place], [address] Phone: [phone]   Fax:  [phone]  Physical Therapy Evaluation  Patient Details  Name: Holly Larson MRN: [id number] Date of Birth: 09/04/1934 Referring Provider: Hardin Negus, MD  Encounter Date: 01/31/2016      PT End of Session - 01/31/16 1132    Visit Number 1   Number of Visits 10   Date for PT Re-Evaluation 02/28/16   PT Start Time 1100   PT Stop Time 1133   PT Time Calculation (min) 33 min   Activity Tolerance Patient tolerated treatment well   Behavior During Therapy [facility] for tasks assessed/performed      Past Medical History  Diagnosis Date  . Hypertension   . Hypercholesterolemia   . Osteoporosis   . Arthritis   . Gilbert's syndrome     Past Surgical History  Procedure Laterality Date  . Parathyroidectomy      There were no vitals filed for this visit.       Subjective Assessment - 01/31/16 1107    Subjective Pt presents to PT with osteoporosis.  Pt would like to receive education and exercises safe to perform.     Pertinent History t-score: radius (-3.1), Rt femoral neck (-2.0)   Diagnostic tests t-score: radius (-3.1), Rt femoral neck (-2.0)   Patient Stated Goals learn exercise for osteoprosis, osteo education   Currently in Pain? No/denies            [facility] PT Assessment - 01/31/16 0001    Assessment   Medical Diagnosis age related osteoporosis   Referring Provider Hardin Negus, MD   Onset Date/Surgical Date 01/30/98   Next MD Visit 06/2016   Prior Therapy none   Precautions   Precautions Other (comment)  osteoporosis   Precaution Comments no twisting, joint mobs   Restrictions   Weight Bearing Restrictions No   Balance Screen   Has the patient fallen in the past 6 months No   Has the patient had a decrease in activity level because of a fear of falling?  No   Is the patient reluctant to leave  their home because of a fear of falling?  No   Home Environment   Living Environment Private residence   Living Arrangements Alone   Type of [place] to enter   Entrance Stairs-Number of Steps 4   [place] Two level   Prior Function   Level of [place] Retired   Leisure walk dog, exercise at [employer]   Cognition   Overall Cognitive Status Within Functional Limits for tasks assessed   Posture/Postural Control   Posture/Postural Control Postural limitations   Postural Limitations Forward head;Rounded Shoulders;Flexed trunk   ROM / Strength   AROM / PROM / Strength AROM;PROM;Strength   AROM   Overall AROM  Within functional limits for tasks performed   PROM   Overall PROM  Within functional limits for tasks performed   Strength   Overall Strength Within functional limits for tasks performed   Overall Strength Comments 4+/5 bilateral UE and LE strength   Palpation   Palpation comment no palpable tenderness   Ambulation/Gait   Ambulation/Gait Yes   Gait Pattern Within Functional Limits                           PT Education - 01/31/16 1125  Education provided Yes   Education Details scapular strength, body mechanics and posture   Person(s) Educated Patient   Methods Explanation;Demonstration;Handout   Comprehension Verbalized understanding;Returned demonstration             PT Long Term Goals - 01/31/16 1138    PT LONG TERM GOAL #1   Title verbally understand ways to manage osteoporosis with direct and correct mechanics to reduce strain on the spine   Time 4   Period Weeks   Status New   PT LONG TERM GOAL #2   Title verbally understand correct body mechanics for home tasks to reduce strain on the spine   Time 4   Period Weeks   Status New   PT LONG TERM GOAL #3   Title verbally understand ways to strengthen postural musculature and have comprehensive HEP to achieve this   Time 4    Period Weeks   Status New   PT LONG TERM GOAL #4   Title verbally understand the dos and don'ts of osteoporosis management   Time 4   Period Weeks   Status New               Plan - 01/31/16 1133    Clinical Impression Statement Pt presents to PT with history of osteoporosis for education regarding safe exercise progression, body mechanics and osteoporosis education.  Pt demonstrates postural weakness, forward head and rounded shoulder with forward trunk flexion.  Pt will benefit from skilled PT for strength progression, osteo info and body mechanics education.     Rehab Potential Good   PT Frequency 1x / week   PT Duration 4 weeks   PT Treatment/Interventions ADLs/Self Care Home Management;Cryotherapy;Moist Heat;Therapeutic exercise;Therapeutic activities;Neuromuscular re-education;Manual techniques;Patient/family education;Balance training   PT Next Visit Plan 1-2 session for strength, posture education/strength, osteoporosis information   Consulted and Agree with Plan of Care Patient      Patient will benefit from skilled therapeutic intervention in order to improve the following deficits and impairments:  Postural dysfunction, Decreased strength, Decreased knowledge of precautions  Visit Diagnosis: Abnormal posture - Plan: PT plan of care cert/re-cert  Muscle weakness (generalized) - Plan: PT plan of care cert/re-cert      G-Codes - [id number]    Functional Assessment Tool Used clinical judgement   Functional Limitation Other PT primary   Other PT Primary Current Status [id number]) At least 1 percent but less than 20 percent impaired, limited or restricted   Other PT Primary Goal Status [id number]) 0 percent impaired, limited or restricted       Problem List Patient Active Problem List   Diagnosis Date Noted  . Abnormal mammogram  12/2014 see path report   repeat in 6 mos 12/20/2014  . Colon polyp 06/29/2014  . Gilbert's syndrome 06/29/2014  . Diverticulosis of  colon without hemorrhage 06/29/2014  . HTN (hypertension) 06/28/2014  . Hyperlipidemia 06/28/2014  . DJD (degenerative joint disease) 06/28/2014  . Osteopenia 06/28/2014  . Varicose veins 06/28/2014  . Parathyroid adenoma  S/P parathyroidectomy adenoma 06/28/2014     Sigurd Sos, PT 01/31/2016 3:10 PM  [facility] [address] [place], [place], [address] Phone: [phone]   Fax:  [phone]  Name: Holly Larson MRN: [id number] Date of Birth: 1934-12-31

## 2016-02-14 ENCOUNTER — Ambulatory Visit: Payer: Medicare Other | Attending: Internal Medicine

## 2016-02-14 DIAGNOSIS — M6281 Muscle weakness (generalized): Secondary | ICD-10-CM | POA: Diagnosis present

## 2016-02-14 DIAGNOSIS — R293 Abnormal posture: Secondary | ICD-10-CM | POA: Diagnosis not present

## 2016-02-14 NOTE — Therapy (Signed)
[facility] [address] [place], [place], [address] Phone: [phone]   Fax:  [phone]  Physical Therapy Treatment  Patient Details  Name: Holly Larson MRN: [id number] Date of Birth: 1934-11-06 Referring Provider: Hardin Negus, MD  Encounter Date: 02/14/2016      PT End of Session - 02/14/16 1049    Visit Number 2   Number of Visits 10   Date for PT Re-Evaluation 02/28/16   PT Start Time 1017   PT Stop Time 1046  no pain, exercise only needed to advance HEP   PT Time Calculation (min) 29 min   Activity Tolerance Patient tolerated treatment well   Behavior During Therapy [facility] for tasks assessed/performed      Past Medical History  Diagnosis Date  . Hypertension   . Hypercholesterolemia   . Osteoporosis   . Arthritis   . Gilbert's syndrome     Past Surgical History  Procedure Laterality Date  . Parathyroidectomy      There were no vitals filed for this visit.      Subjective Assessment - 02/14/16 1020    Subjective Doing exercises and trying to work on my posture.     Diagnostic tests t-score: radius (-3.1), Rt femoral neck (-2.0)   Patient Stated Goals learn exercise for osteoprosis, osteo education   Currently in Pain? No/denies                         [facility] Adult PT Treatment/Exercise - 02/14/16 0001    Exercises   Exercises Neck;Shoulder;Knee/Hip   Neck Exercises: Machines for Strengthening   UBE (Upper Arm Bike) Level 1 x 6 minutes (3/3)   Knee/Hip Exercises: Standing   Hip Abduction Stengthening;Both;2 sets;10 reps   Hip Extension Stengthening;Both;20 reps   Shoulder Exercises: Seated   Horizontal ABduction Strengthening;Both;20 reps;Theraband   Shoulder Exercises: Standing   Extension Strengthening;Both;20 reps;Theraband   Theraband Level (Shoulder Extension) Level 2 (Red)   Row Strengthening;Both;20 reps;Theraband   Theraband Level (Shoulder Row)  Level 2 (Red)   Other Standing Exercises snow angels and flexion wiht back against the wall 2x10 each                PT Education - 02/14/16 1048    Education provided Yes   Education Details osteo info, standing hip exercises, against the wall activity   Person(s) Educated Patient   Methods Explanation;Demonstration;Handout   Comprehension Verbalized understanding;Returned demonstration             PT Long Term Goals - 02/14/16 1020    PT LONG TERM GOAL #1   Title verbally understand ways to manage osteoporosis with direct and correct mechanics to reduce strain on the spine   Time 4   Period Weeks   Status Achieved   PT LONG TERM GOAL #2   Title verbally understand correct body mechanics for home tasks to reduce strain on the spine   Status Achieved   PT LONG TERM GOAL #4   Title verbally understand the dos and don'ts of osteoporosis management   Status Achieved               Plan - 02/14/16 1050    Clinical Impression Statement Pt is performing HEP regularly at home.  Pt required minor verbal cues for techinque with scapular attached exercise.  PT advanced HEP for postural and weightbearing strength at home.  Pt will attend 1 more session for  advancement of HEP for posutral weakness and re-education and weightbearing activity.     Rehab Potential Good   PT Frequency 1x / week   PT Duration 4 weeks   PT Treatment/Interventions ADLs/Self Care Home Management;Cryotherapy;Moist Heat;Therapeutic exercise;Therapeutic activities;Neuromuscular re-education;Manual techniques;Patient/family education;Balance training   PT Next Visit Plan 1 more session.  G-codes and D/C   Consulted and Agree with Plan of Care Patient      Patient will benefit from skilled therapeutic intervention in order to improve the following deficits and impairments:  Postural dysfunction, Decreased strength, Decreased knowledge of precautions  Visit Diagnosis: Abnormal posture  Muscle  weakness (generalized)     Problem List Patient Active Problem List   Diagnosis Date Noted  . Abnormal mammogram  12/2014 see path report   repeat in 6 mos 12/20/2014  . Colon polyp 06/29/2014  . Gilbert's syndrome 06/29/2014  . Diverticulosis of colon without hemorrhage 06/29/2014  . HTN (hypertension) 06/28/2014  . Hyperlipidemia 06/28/2014  . DJD (degenerative joint disease) 06/28/2014  . Osteopenia 06/28/2014  . Varicose veins 06/28/2014  . Parathyroid adenoma  S/P parathyroidectomy adenoma 06/28/2014     Sigurd Sos, PT 02/14/2016 10:53 AM  [facility] [address] [place], [place], [address] Phone: [phone]   Fax:  [phone]  Name: Holly Larson MRN: [id number] Date of Birth: 1934/11/25

## 2016-02-14 NOTE — Patient Instructions (Signed)
DO's and DON'T's   Avoid and/or Minimize positions of forward bending ( flexion)  Side bending and rotation of the trunk  Especially when movements occur together   When your back aches:   Don't sit down   Lie down on your back with a small pillow under your head and one under your knees or as outlined by our therapist. Or, lie in the 90/90 position ( on the floor with your feet and legs on the sofa with knees and hips bent to 90 degrees)  Tying or putting on your shoes:   Don't bend over to tie your shoes or put on socks.  Instead, bring one foot up, cross it over the opposite knee and bend forward (hinge) at the hips to so the task.  Keep your back straight.  If you cannot do this safely, then you need to use long handled assistive devices such as a shoehorn and sock puller.  Exercising:  Don't engage in ballistic types of exercise routines such as high-impact aerobics or jumping rope  Don't do exercises in the gym that bring you forward (abdominal crunches, sit-ups, touching your  toes, knee-to-chest, straight leg raising.)  Follow a regular exercise program that includes a variety of different weight-bearing activities, such as low-impact aerobics, T' ai chi or walking as your physical therapist advises  Do exercises that emphasize return to normal body alignment and strengthening of the muscles that keep your back straight, as outlined in this program or by your therapist  Household tasks:  Don't reach unnecessarily or twist your trunk when mopping, sweeping, vacuuming, raking, making beds, weeding gardens, getting objects ou of cupboards, etc.  Keep your broom, mop, vacuum, or rake close to you and mover your whole body as you move them. Walk over to the area on which you are working. Arrange kitchen, bathroom, and bedroom shelves so that frequently used items may be reached without excessive bending, twisting, and reaching.  Use a  sturdy stool if necessary.  Don't bend from the waist to pick up something up  Off the floor, out of the trunk of your car, or to brush your teeth, wash your face, etc.   Bend at the knees, keeping back straight as possible. Use a reacher if necessary.   Prevention of fracture is the so-called "BOTTOm -Line" in the management of OSTEOPOROSIS. Do not take unnecessary chances in movement. Once a compression fracture occurs, the process is very difficult to control; one fracture is frequently followed by many more.       ABDUCTION: Standing (Active)   Stand, feet flat. Lift right leg out to side. Use _0__ lbs. Complete __10_ repetitions. Perform __2_ sessions per day.      EXTENSION: Standing (Active)  Stand, both feet flat. Draw right leg behind body as far as possible. Use 0___ lbs. Complete 10 repetitions. Perform __2_ sessions per day.  Copyright  VHI. All rights reserved.    [employer] Stand with back to wall and elbows/forearms at 90 degrees as shown. Bring hands toward each other as if doing a 'snow angel'. Repeat 10 Times Hold 0 Seconds Complete 1 Set Perform 1 Time(s) a Day    Wall posture with shoulder flexion Stand to do this. Stand with proper posture against wall: shoulders back touching wall, chin in, core tight. While holding this position, slowly raise one arm. If you feel your upper back come off the wall, stop, and return to starting position. Go up as high as you  can. Both arms at the same time. Repeat 10 Times Complete 1 Set Perform 1 Time(s) a Day   [facility] [address] [place], [place] [address] Phone # [phone] Fax [phone]

## 2016-02-26 ENCOUNTER — Ambulatory Visit: Payer: Medicare Other

## 2016-02-26 DIAGNOSIS — R293 Abnormal posture: Secondary | ICD-10-CM | POA: Diagnosis not present

## 2016-02-26 DIAGNOSIS — M6281 Muscle weakness (generalized): Secondary | ICD-10-CM

## 2016-02-26 NOTE — Therapy (Signed)
[facility] [address] [place], [place], [address] Phone: [phone]   Fax:  [phone]  Physical Therapy Treatment  Patient Details  Name: Holly Larson MRN: [id number] Date of Birth: 1935-05-22 Referring Provider: Hardin Negus, MD  Encounter Date: 02/26/2016      PT End of Session - 02/26/16 1033    Visit Number 3   PT Start Time 4888   PT Stop Time 9169  final session, advanced HEP, no pain   PT Time Calculation (min) 25 min   Activity Tolerance Patient tolerated treatment well   Behavior During Therapy [facility] for tasks assessed/performed      Past Medical History:  Diagnosis Date  . Arthritis   . Gilbert's syndrome   . Hypercholesterolemia   . Hypertension   . Osteoporosis     Past Surgical History:  Procedure Laterality Date  . PARATHYROIDECTOMY      There were no vitals filed for this visit.      Subjective Assessment - 02/26/16 1018    Subjective Ready for discharge to HEP   Diagnostic tests t-score: radius (-3.1), Rt femoral neck (-2.0)   Patient Stated Goals learn exercise for osteoprosis, osteo education   Currently in Pain? No/denies            [facility] PT Assessment - 02/26/16 0001      Assessment   Medical Diagnosis age related osteoporosis   Onset Date/Surgical Date 01/30/98     Precautions   Precautions Other (comment)  osteoporosis   Precaution Comments no twisting, joint mobs     Prior Function   Level of Independence Independent   Vocation Retired     Associate Professor   Overall Cognitive Status Within Functional Limits for tasks assessed     Posture/Postural Control   Posture/Postural Control Postural limitations   Postural Limitations Forward head;Rounded Shoulders;Flexed trunk                     [facility] Adult PT Treatment/Exercise - 02/26/16 0001      Neck Exercises: Machines for Strengthening   UBE (Upper Arm Bike) Level 1 x 6 minutes  (3/3)     Knee/Hip Exercises: Standing   Hip Abduction Stengthening;Both;2 sets;10 reps   Hip Extension Stengthening;Both;20 reps     Shoulder Exercises: Seated   Horizontal ABduction Strengthening;Both;20 reps;Theraband   Flexion Strengthening;Both;20 reps   Flexion Weight (lbs) 1   Abduction Strengthening;Both;20 reps;Weights   ABduction Weight (lbs) 1   ABduction Limitations scaption 1# added 2x10     Shoulder Exercises: Standing   Extension Strengthening;Both;20 reps;Theraband   Theraband Level (Shoulder Extension) Level 3 (Green)   Row Strengthening;Both;20 reps;Theraband   Theraband Level (Shoulder Row) Level 3 (Green)  issued for HEP   Other Standing Exercises snow angels and flexion wiht back against the wall 2x10 each                PT Education - 02/26/16 1031    Education provided Yes   Education Details 3 way raises   Person(s) Educated Patient   Methods Explanation;Demonstration;Handout   Comprehension Verbalized understanding;Returned demonstration             PT Long Term Goals - 02/26/16 1018      PT LONG TERM GOAL #1   Title verbally understand ways to manage osteoporosis with direct and correct mechanics to reduce strain on the spine   Status Achieved     PT LONG TERM  GOAL #2   Title verbally understand correct body mechanics for home tasks to reduce strain on the spine   Status Achieved     PT LONG TERM GOAL #3   Title verbally understand ways to strengthen postural musculature and have comprehensive HEP to achieve this   Status Achieved     PT LONG TERM GOAL #4   Title verbally understand the dos and don'ts of osteoporosis management   Status Achieved               Plan - 11-Mar-2016 1019    Clinical Impression Statement Pt is independent and consitent with HEP for strength.  Pt will discharge to HEP and will continue to make postural corrections to reduce stress on spine and reduce risk of fracture.     PT Next Visit Plan  D/C to HEP      Patient will benefit from skilled therapeutic intervention in order to improve the following deficits and impairments:     Visit Diagnosis: Abnormal posture  Muscle weakness (generalized)       G-Codes - 2016-03-11 1017    Functional Assessment Tool Used clinical judgement   Functional Limitation Other PT primary   Other PT Primary Goal Status (I6270) 0 percent impaired, limited or restricted   Other PT Primary Discharge Status (J5009) 0 percent impaired, limited or restricted      Problem List Patient Active Problem List   Diagnosis Date Noted  . Abnormal mammogram  12/2014 see path report   repeat in 6 mos 12/20/2014  . Colon polyp 06/29/2014  . Gilbert's syndrome 06/29/2014  . Diverticulosis of colon without hemorrhage 06/29/2014  . HTN (hypertension) 06/28/2014  . Hyperlipidemia 06/28/2014  . DJD (degenerative joint disease) 06/28/2014  . Osteopenia 06/28/2014  . Varicose veins 06/28/2014  . Parathyroid adenoma  S/P parathyroidectomy adenoma 06/28/2014  PHYSICAL THERAPY DISCHARGE SUMMARY  Visits from Start of Care: 3  Current functional level related to goals / functional outcomes: See above for current status.  All goals met.   Remaining deficits: No deficits remain.     Education / Equipment: Banker, osteoporosis information Plan: Patient agrees to discharge.  Patient goals were met. Patient is being discharged due to meeting the stated rehab goals.  ?????     Sigurd Sos, PT 11-Mar-2016 10:41 AM  [facility] [address] [place], [place], [address] Phone: [phone]   Fax:  [phone]  Name: Holly Larson MRN: [id number] Date of Birth: June 30, 1935

## 2016-02-26 NOTE — Patient Instructions (Addendum)
SHOULDER: Flexion Unilateral (Weight)   Start with arm at side. Raise both arms forward and up to shoulder high  Keep elbows straight.  Use   lb weight.10  reps per set, 2-3___ sets per day.  Copyright  VHI. All rights reserved.  SHOULDER: Abduction (Weight)   Raise arm out and up. Keep elbow straight. Do not shrug shoulders. Use  # lb weight. _10__ reps per set, 2-_3__ sets per day.  Copyright  VHI. All rights reserved.  SHOULDER: Scaption (Weight)   Place arm at 45 angle to body. Raise arm up to shoulder level - shoulder keeping elbow straight.  Use   lb weight. _10__ reps per set, _2-3__ sets per day.  [facility] [address], [address] [place], [place] [address] Phone # [phone] Fax [phone]   

## 2016-03-11 DIAGNOSIS — M810 Age-related osteoporosis without current pathological fracture: Secondary | ICD-10-CM | POA: Diagnosis not present

## 2016-03-11 DIAGNOSIS — D351 Benign neoplasm of parathyroid gland: Secondary | ICD-10-CM | POA: Diagnosis not present

## 2016-06-01 DIAGNOSIS — Z23 Encounter for immunization: Secondary | ICD-10-CM | POA: Diagnosis not present

## 2016-07-22 DIAGNOSIS — H25043 Posterior subcapsular polar age-related cataract, bilateral: Secondary | ICD-10-CM | POA: Diagnosis not present

## 2016-07-22 DIAGNOSIS — H16223 Keratoconjunctivitis sicca, not specified as Sjogren's, bilateral: Secondary | ICD-10-CM | POA: Diagnosis not present

## 2016-07-22 DIAGNOSIS — H25013 Cortical age-related cataract, bilateral: Secondary | ICD-10-CM | POA: Diagnosis not present

## 2016-12-11 DIAGNOSIS — H903 Sensorineural hearing loss, bilateral: Secondary | ICD-10-CM | POA: Diagnosis not present

## 2016-12-19 DIAGNOSIS — E7800 Pure hypercholesterolemia, unspecified: Secondary | ICD-10-CM | POA: Diagnosis not present

## 2016-12-19 DIAGNOSIS — M810 Age-related osteoporosis without current pathological fracture: Secondary | ICD-10-CM | POA: Diagnosis not present

## 2016-12-19 DIAGNOSIS — Z0000 Encounter for general adult medical examination without abnormal findings: Secondary | ICD-10-CM | POA: Diagnosis not present

## 2016-12-19 DIAGNOSIS — E559 Vitamin D deficiency, unspecified: Secondary | ICD-10-CM | POA: Diagnosis not present

## 2016-12-19 DIAGNOSIS — M1990 Unspecified osteoarthritis, unspecified site: Secondary | ICD-10-CM | POA: Diagnosis not present

## 2016-12-19 DIAGNOSIS — I10 Essential (primary) hypertension: Secondary | ICD-10-CM | POA: Diagnosis not present

## 2016-12-19 DIAGNOSIS — Z23 Encounter for immunization: Secondary | ICD-10-CM | POA: Diagnosis not present

## 2016-12-19 DIAGNOSIS — Z1389 Encounter for screening for other disorder: Secondary | ICD-10-CM | POA: Diagnosis not present

## 2016-12-31 DIAGNOSIS — Z1211 Encounter for screening for malignant neoplasm of colon: Secondary | ICD-10-CM | POA: Diagnosis not present

## 2016-12-31 DIAGNOSIS — Z1212 Encounter for screening for malignant neoplasm of rectum: Secondary | ICD-10-CM | POA: Diagnosis not present

## 2016-12-31 DIAGNOSIS — M170 Bilateral primary osteoarthritis of knee: Secondary | ICD-10-CM | POA: Diagnosis not present

## 2017-01-27 DIAGNOSIS — D126 Benign neoplasm of colon, unspecified: Secondary | ICD-10-CM | POA: Diagnosis not present

## 2017-01-27 DIAGNOSIS — R195 Other fecal abnormalities: Secondary | ICD-10-CM | POA: Diagnosis not present

## 2017-02-27 DIAGNOSIS — K648 Other hemorrhoids: Secondary | ICD-10-CM | POA: Diagnosis not present

## 2017-02-27 DIAGNOSIS — R195 Other fecal abnormalities: Secondary | ICD-10-CM | POA: Diagnosis not present

## 2017-02-27 DIAGNOSIS — K5730 Diverticulosis of large intestine without perforation or abscess without bleeding: Secondary | ICD-10-CM | POA: Diagnosis not present

## 2017-03-24 DIAGNOSIS — M170 Bilateral primary osteoarthritis of knee: Secondary | ICD-10-CM | POA: Diagnosis not present

## 2017-05-15 DIAGNOSIS — Z23 Encounter for immunization: Secondary | ICD-10-CM | POA: Diagnosis not present

## 2017-08-12 DIAGNOSIS — H40023 Open angle with borderline findings, high risk, bilateral: Secondary | ICD-10-CM | POA: Diagnosis not present

## 2017-08-12 DIAGNOSIS — H25013 Cortical age-related cataract, bilateral: Secondary | ICD-10-CM | POA: Diagnosis not present

## 2017-08-12 DIAGNOSIS — H16223 Keratoconjunctivitis sicca, not specified as Sjogren's, bilateral: Secondary | ICD-10-CM | POA: Diagnosis not present

## 2017-08-12 DIAGNOSIS — H25043 Posterior subcapsular polar age-related cataract, bilateral: Secondary | ICD-10-CM | POA: Diagnosis not present

## 2017-10-09 DIAGNOSIS — E7800 Pure hypercholesterolemia, unspecified: Secondary | ICD-10-CM | POA: Diagnosis not present

## 2017-10-09 DIAGNOSIS — M810 Age-related osteoporosis without current pathological fracture: Secondary | ICD-10-CM | POA: Diagnosis not present

## 2017-10-09 DIAGNOSIS — I10 Essential (primary) hypertension: Secondary | ICD-10-CM | POA: Diagnosis not present

## 2017-10-09 DIAGNOSIS — E559 Vitamin D deficiency, unspecified: Secondary | ICD-10-CM | POA: Diagnosis not present

## 2018-02-18 DIAGNOSIS — D485 Neoplasm of uncertain behavior of skin: Secondary | ICD-10-CM | POA: Diagnosis not present

## 2018-02-18 DIAGNOSIS — L570 Actinic keratosis: Secondary | ICD-10-CM | POA: Diagnosis not present

## 2018-04-17 DIAGNOSIS — H40012 Open angle with borderline findings, low risk, left eye: Secondary | ICD-10-CM | POA: Diagnosis not present

## 2018-04-29 DIAGNOSIS — Z23 Encounter for immunization: Secondary | ICD-10-CM | POA: Diagnosis not present

## 2018-06-02 DIAGNOSIS — Z23 Encounter for immunization: Secondary | ICD-10-CM | POA: Diagnosis not present

## 2018-06-02 DIAGNOSIS — L720 Epidermal cyst: Secondary | ICD-10-CM | POA: Diagnosis not present

## 2018-06-02 DIAGNOSIS — L821 Other seborrheic keratosis: Secondary | ICD-10-CM | POA: Diagnosis not present

## 2018-06-02 DIAGNOSIS — L570 Actinic keratosis: Secondary | ICD-10-CM | POA: Diagnosis not present

## 2018-06-02 DIAGNOSIS — D485 Neoplasm of uncertain behavior of skin: Secondary | ICD-10-CM | POA: Diagnosis not present

## 2018-06-02 DIAGNOSIS — B079 Viral wart, unspecified: Secondary | ICD-10-CM | POA: Diagnosis not present

## 2018-06-10 ENCOUNTER — Other Ambulatory Visit: Payer: Self-pay | Admitting: Internal Medicine

## 2018-06-10 DIAGNOSIS — Z1389 Encounter for screening for other disorder: Secondary | ICD-10-CM | POA: Diagnosis not present

## 2018-06-10 DIAGNOSIS — E2839 Other primary ovarian failure: Secondary | ICD-10-CM

## 2018-06-10 DIAGNOSIS — E7800 Pure hypercholesterolemia, unspecified: Secondary | ICD-10-CM | POA: Diagnosis not present

## 2018-06-10 DIAGNOSIS — Z0000 Encounter for general adult medical examination without abnormal findings: Secondary | ICD-10-CM | POA: Diagnosis not present

## 2018-06-10 DIAGNOSIS — Z124 Encounter for screening for malignant neoplasm of cervix: Secondary | ICD-10-CM | POA: Diagnosis not present

## 2018-06-10 DIAGNOSIS — M170 Bilateral primary osteoarthritis of knee: Secondary | ICD-10-CM | POA: Diagnosis not present

## 2018-06-10 DIAGNOSIS — Z1239 Encounter for other screening for malignant neoplasm of breast: Secondary | ICD-10-CM | POA: Diagnosis not present

## 2018-06-10 DIAGNOSIS — Z1231 Encounter for screening mammogram for malignant neoplasm of breast: Secondary | ICD-10-CM

## 2018-06-10 DIAGNOSIS — M8580 Other specified disorders of bone density and structure, unspecified site: Secondary | ICD-10-CM

## 2018-06-10 DIAGNOSIS — I10 Essential (primary) hypertension: Secondary | ICD-10-CM | POA: Diagnosis not present

## 2018-06-10 DIAGNOSIS — M810 Age-related osteoporosis without current pathological fracture: Secondary | ICD-10-CM | POA: Diagnosis not present

## 2018-07-09 DIAGNOSIS — E7800 Pure hypercholesterolemia, unspecified: Secondary | ICD-10-CM | POA: Diagnosis not present

## 2018-07-09 DIAGNOSIS — I10 Essential (primary) hypertension: Secondary | ICD-10-CM | POA: Diagnosis not present

## 2018-07-09 DIAGNOSIS — M810 Age-related osteoporosis without current pathological fracture: Secondary | ICD-10-CM | POA: Diagnosis not present

## 2018-08-18 ENCOUNTER — Ambulatory Visit: Payer: Medicare Other

## 2018-08-18 ENCOUNTER — Other Ambulatory Visit: Payer: Medicare Other

## 2018-08-21 DIAGNOSIS — H25043 Posterior subcapsular polar age-related cataract, bilateral: Secondary | ICD-10-CM | POA: Diagnosis not present

## 2018-08-21 DIAGNOSIS — H25013 Cortical age-related cataract, bilateral: Secondary | ICD-10-CM | POA: Diagnosis not present

## 2018-08-21 DIAGNOSIS — H16223 Keratoconjunctivitis sicca, not specified as Sjogren's, bilateral: Secondary | ICD-10-CM | POA: Diagnosis not present

## 2018-08-21 DIAGNOSIS — H40012 Open angle with borderline findings, low risk, left eye: Secondary | ICD-10-CM | POA: Diagnosis not present

## 2018-09-09 ENCOUNTER — Ambulatory Visit
Admission: RE | Admit: 2018-09-09 | Discharge: 2018-09-09 | Disposition: A | Discharge status: Home / Self Care | Payer: Medicare Other | Source: Ambulatory Visit | Attending: Internal Medicine | Admitting: Internal Medicine

## 2018-09-09 DIAGNOSIS — Z780 Asymptomatic menopausal state: Secondary | ICD-10-CM | POA: Diagnosis not present

## 2018-09-09 DIAGNOSIS — E2839 Other primary ovarian failure: Secondary | ICD-10-CM

## 2018-09-09 DIAGNOSIS — M85851 Other specified disorders of bone density and structure, right thigh: Secondary | ICD-10-CM | POA: Diagnosis not present

## 2018-09-09 DIAGNOSIS — M810 Age-related osteoporosis without current pathological fracture: Secondary | ICD-10-CM | POA: Diagnosis not present

## 2018-09-09 DIAGNOSIS — Z1231 Encounter for screening mammogram for malignant neoplasm of breast: Secondary | ICD-10-CM | POA: Diagnosis not present

## 2018-09-09 DIAGNOSIS — M8580 Other specified disorders of bone density and structure, unspecified site: Secondary | ICD-10-CM

## 2019-01-08 DIAGNOSIS — I10 Essential (primary) hypertension: Secondary | ICD-10-CM | POA: Diagnosis not present

## 2019-01-08 DIAGNOSIS — E7800 Pure hypercholesterolemia, unspecified: Secondary | ICD-10-CM | POA: Diagnosis not present

## 2019-01-19 DIAGNOSIS — I10 Essential (primary) hypertension: Secondary | ICD-10-CM | POA: Diagnosis not present

## 2019-04-27 DIAGNOSIS — Z23 Encounter for immunization: Secondary | ICD-10-CM | POA: Diagnosis not present

## 2019-06-24 DIAGNOSIS — M810 Age-related osteoporosis without current pathological fracture: Secondary | ICD-10-CM | POA: Diagnosis not present

## 2019-06-24 DIAGNOSIS — I10 Essential (primary) hypertension: Secondary | ICD-10-CM | POA: Diagnosis not present

## 2019-06-24 DIAGNOSIS — E7800 Pure hypercholesterolemia, unspecified: Secondary | ICD-10-CM | POA: Diagnosis not present

## 2019-06-24 DIAGNOSIS — Z1389 Encounter for screening for other disorder: Secondary | ICD-10-CM | POA: Diagnosis not present

## 2019-06-24 DIAGNOSIS — E559 Vitamin D deficiency, unspecified: Secondary | ICD-10-CM | POA: Diagnosis not present

## 2019-06-24 DIAGNOSIS — Z0000 Encounter for general adult medical examination without abnormal findings: Secondary | ICD-10-CM | POA: Diagnosis not present

## 2019-08-20 ENCOUNTER — Ambulatory Visit: Payer: Medicare Other | Attending: Internal Medicine

## 2019-08-20 DIAGNOSIS — Z23 Encounter for immunization: Secondary | ICD-10-CM

## 2019-08-20 NOTE — Progress Notes (Signed)
   Covid-19 Vaccination Clinic  Name:  Holly Larson    MRN: [id number] DOB: 1934-12-20  08/20/2019  Holly Larson was observed post Covid-19 immunization for 15 minutes without incidence. She was provided with Vaccine Information Sheet and instruction to access the V-Safe system.   Holly Larson was instructed to call 911 with any severe reactions post vaccine: Marland Kitchen Difficulty breathing  . Swelling of your face and throat  . A fast heartbeat  . A bad rash all over your body  . Dizziness and weakness    Immunizations Administered    Name Date Dose VIS Date Route   Pfizer COVID-19 Vaccine 08/20/2019 11:52 AM 0.3 mL 07/16/2019 Intramuscular   Manufacturer: [place]   Lot: [id number]   [place]: [id number]

## 2019-08-21 IMAGING — MG DIGITAL SCREENING BILATERAL MAMMOGRAM WITH TOMO AND CAD
8 series · 8 of 24 positions shown · non-contrast
Comparison: Previous exam(s).

CLINICAL DATA: Screening.

EXAM:
DIGITAL SCREENING BILATERAL MAMMOGRAM WITH TOMO AND CAD

[L CC synth-2D]
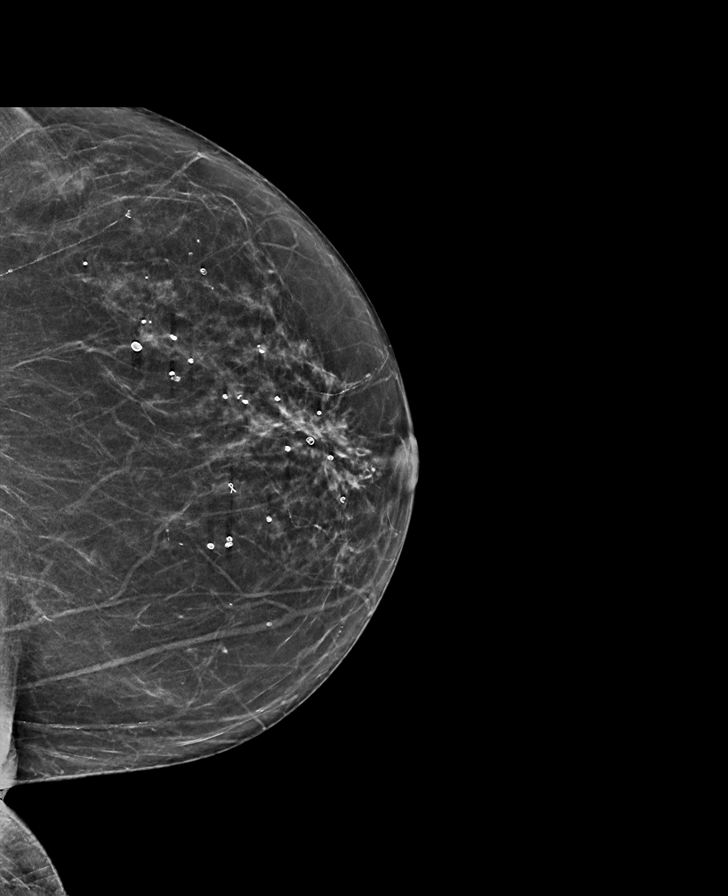

[L MLO synth-2D]
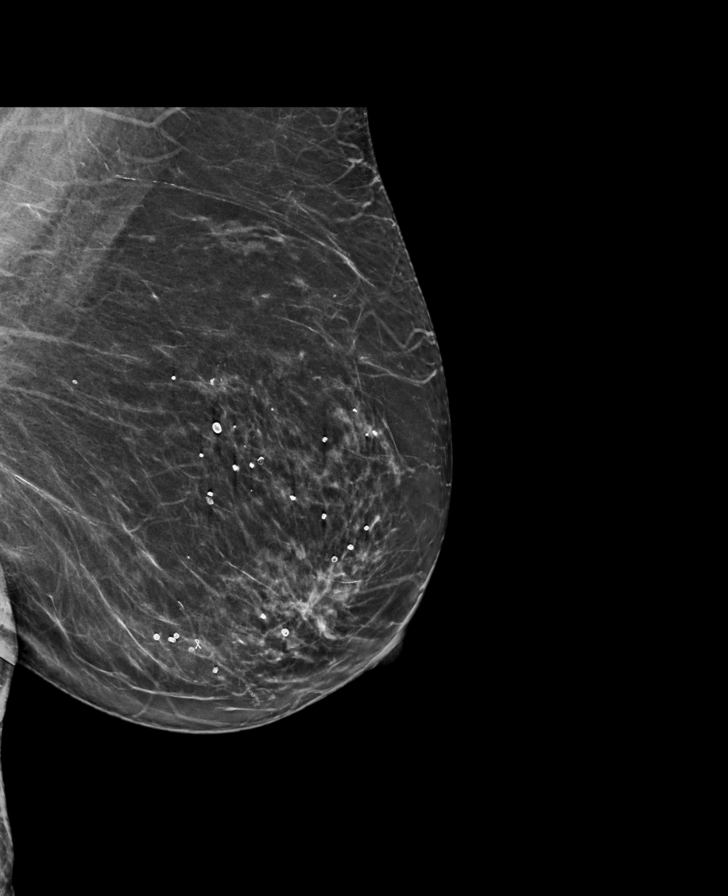

[R CC synth-2D]
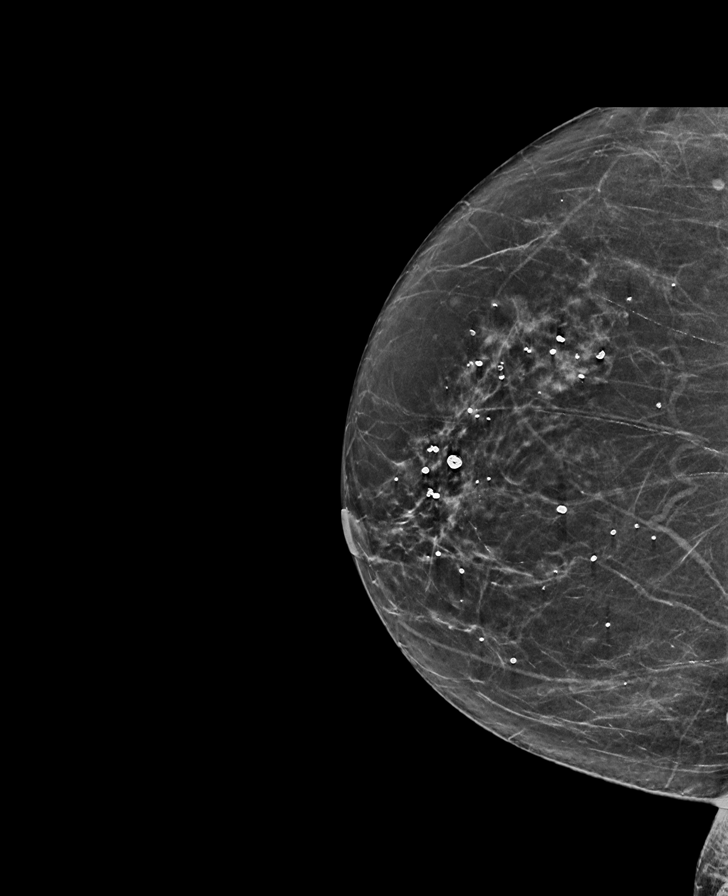

[R MLO synth-2D]
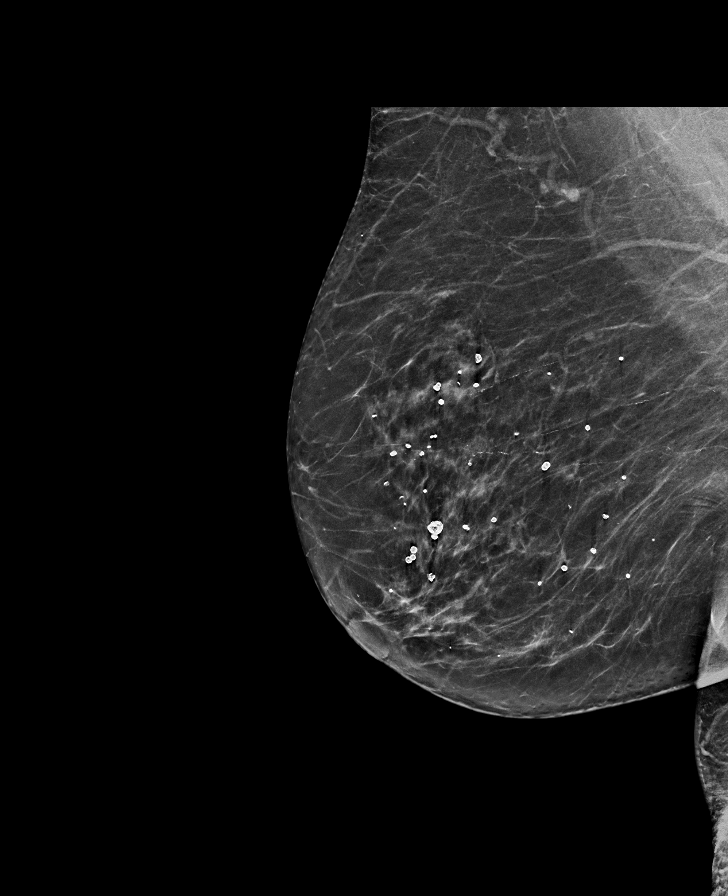

[L CC tomo · tomo slice 33/65.0]
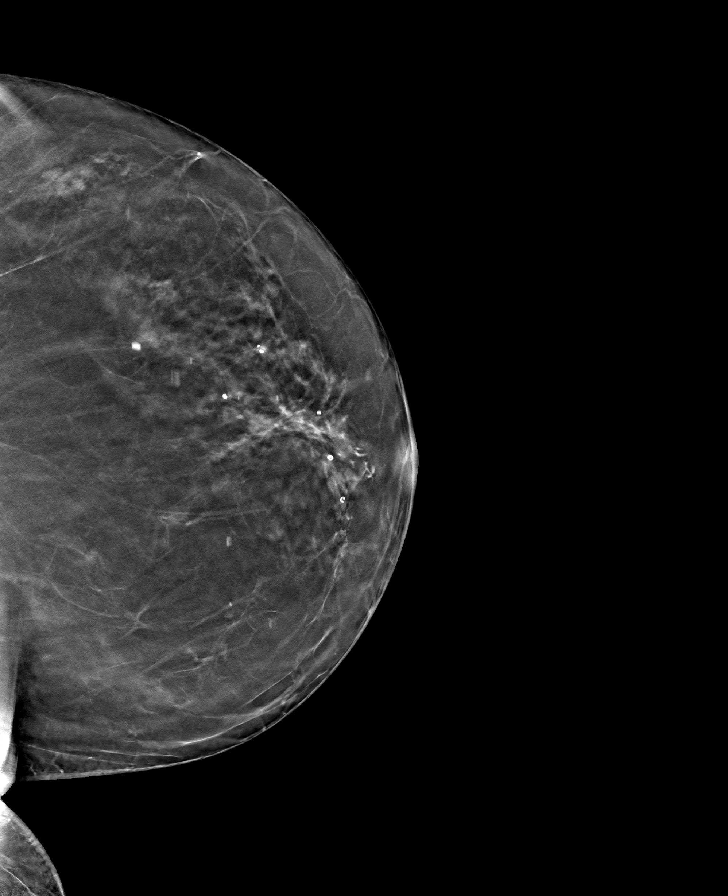

[L MLO tomo · tomo slice 37/72.0]
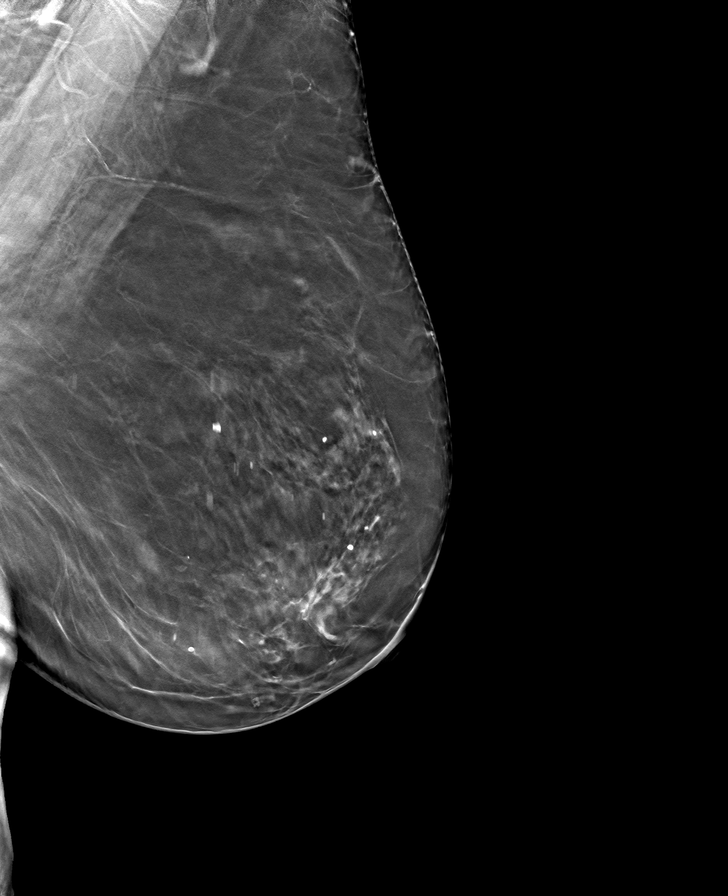

[R CC tomo · tomo slice 33/65.0]
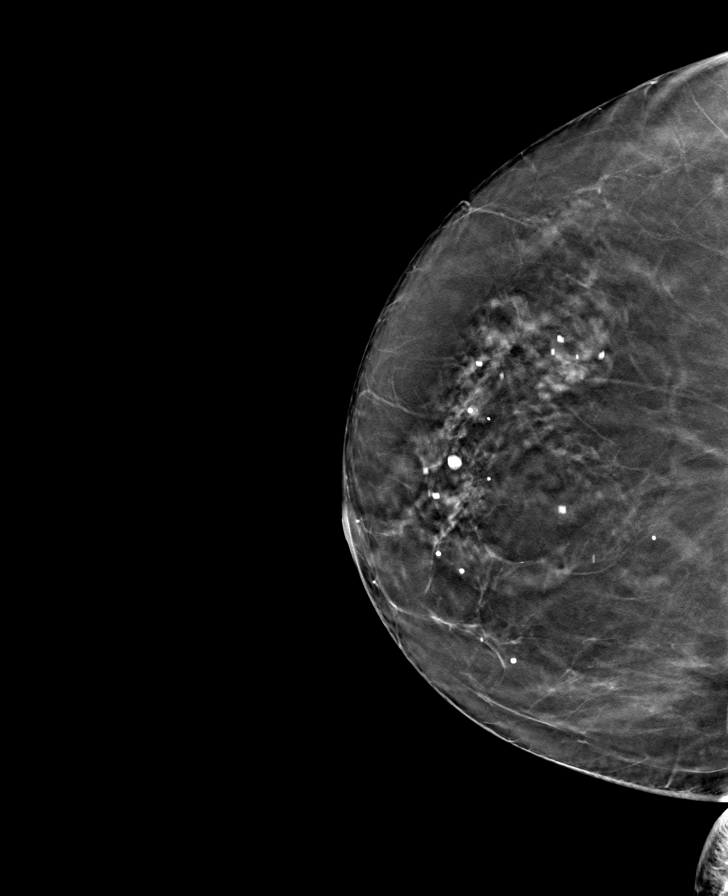

[R MLO tomo · tomo slice 35/69.0]
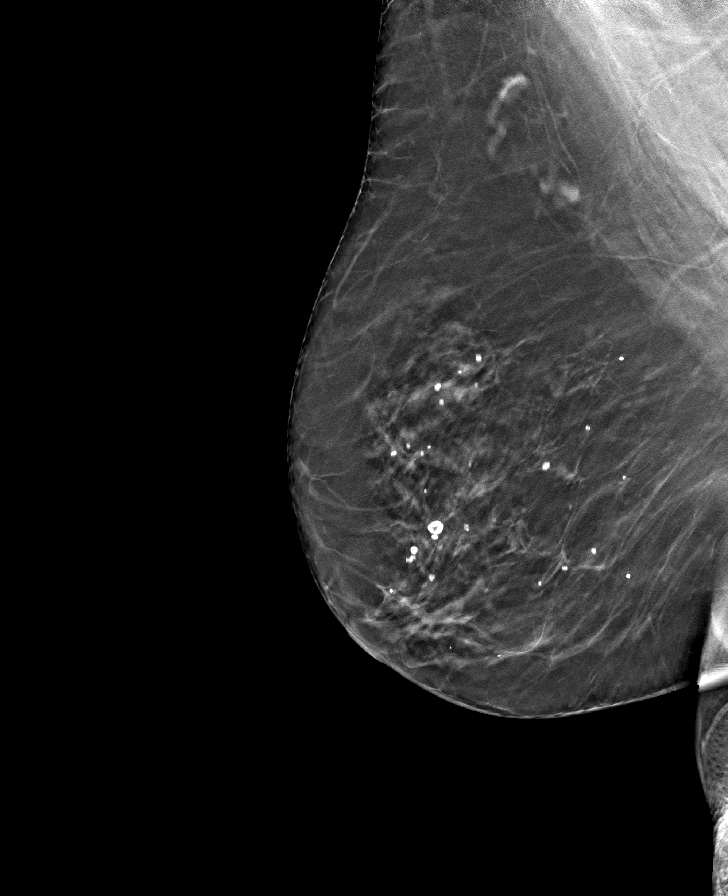

[8 of 24 positions shown; findings below may reference images not displayed]

ACR Breast Density Category b: There are scattered areas of
fibroglandular density.
FINDINGS: There are no findings suspicious for malignancy. Images were
processed with CAD.
IMPRESSION: No mammographic evidence of malignancy. A result letter of this
screening mammogram will be mailed directly to the patient.

RECOMMENDATION:
Screening mammogram in one year. (Code:[id number]-775)

BI-RADS CATEGORY  1: Negative.

## 2019-09-09 ENCOUNTER — Ambulatory Visit: Payer: Medicare Other | Attending: Internal Medicine

## 2019-09-09 DIAGNOSIS — Z23 Encounter for immunization: Secondary | ICD-10-CM | POA: Insufficient documentation

## 2019-09-09 NOTE — Progress Notes (Signed)
   Covid-19 Vaccination Clinic  Name:  Holly Larson    MRN: [id number] DOB: Dec 14, 1934  09/09/2019  Ms. [name] was observed post Covid-19 immunization for 15 minutes without incidence. She was provided with Vaccine Information Sheet and instruction to access the V-Safe system.   Ms. [name] was instructed to call 911 with any severe reactions post vaccine: Marland Kitchen Difficulty breathing  . Swelling of your face and throat  . A fast heartbeat  . A bad rash all over your body  . Dizziness and weakness    Immunizations Administered    Name Date Dose VIS Date Route   Pfizer COVID-19 Vaccine 09/09/2019  1:20 PM 0.3 mL 07/16/2019 Intramuscular   Manufacturer: [place]   Lot: [id number]   [place]: [id number]

## 2020-12-23 DIAGNOSIS — Z20822 Contact with and (suspected) exposure to covid-19: Secondary | ICD-10-CM | POA: Diagnosis not present

## 2020-12-23 DIAGNOSIS — J029 Acute pharyngitis, unspecified: Secondary | ICD-10-CM | POA: Diagnosis not present

## 2020-12-26 DIAGNOSIS — Z1231 Encounter for screening mammogram for malignant neoplasm of breast: Secondary | ICD-10-CM | POA: Diagnosis not present

## 2020-12-26 DIAGNOSIS — I10 Essential (primary) hypertension: Secondary | ICD-10-CM | POA: Diagnosis not present

## 2020-12-26 DIAGNOSIS — E7800 Pure hypercholesterolemia, unspecified: Secondary | ICD-10-CM | POA: Diagnosis not present

## 2020-12-27 DIAGNOSIS — Z20822 Contact with and (suspected) exposure to covid-19: Secondary | ICD-10-CM | POA: Diagnosis not present

## 2020-12-28 ENCOUNTER — Other Ambulatory Visit: Payer: Self-pay | Admitting: Internal Medicine

## 2020-12-28 DIAGNOSIS — Z1231 Encounter for screening mammogram for malignant neoplasm of breast: Secondary | ICD-10-CM

## 2021-02-20 DIAGNOSIS — H26491 Other secondary cataract, right eye: Secondary | ICD-10-CM | POA: Diagnosis not present

## 2021-02-20 DIAGNOSIS — H16223 Keratoconjunctivitis sicca, not specified as Sjogren's, bilateral: Secondary | ICD-10-CM | POA: Diagnosis not present

## 2021-02-23 ENCOUNTER — Other Ambulatory Visit: Payer: Self-pay

## 2021-02-23 ENCOUNTER — Ambulatory Visit
Admission: RE | Admit: 2021-02-23 | Discharge: 2021-02-23 | Disposition: A | Discharge status: Home / Self Care | Payer: Medicare Other | Source: Ambulatory Visit | Attending: Internal Medicine | Admitting: Internal Medicine

## 2021-02-23 DIAGNOSIS — Z1231 Encounter for screening mammogram for malignant neoplasm of breast: Secondary | ICD-10-CM

## 2021-02-28 ENCOUNTER — Other Ambulatory Visit: Payer: Self-pay | Admitting: Internal Medicine

## 2021-02-28 DIAGNOSIS — R928 Other abnormal and inconclusive findings on diagnostic imaging of breast: Secondary | ICD-10-CM

## 2021-03-21 ENCOUNTER — Other Ambulatory Visit: Payer: Self-pay

## 2021-03-21 ENCOUNTER — Ambulatory Visit
Admission: RE | Admit: 2021-03-21 | Discharge: 2021-03-21 | Disposition: A | Discharge status: Home / Self Care | Payer: Medicare Other | Source: Ambulatory Visit | Attending: Internal Medicine | Admitting: Internal Medicine

## 2021-03-21 ENCOUNTER — Ambulatory Visit: Payer: Medicare Other

## 2021-03-21 DIAGNOSIS — R928 Other abnormal and inconclusive findings on diagnostic imaging of breast: Secondary | ICD-10-CM

## 2021-03-21 DIAGNOSIS — R922 Inconclusive mammogram: Secondary | ICD-10-CM | POA: Diagnosis not present

## 2021-03-30 DIAGNOSIS — Z23 Encounter for immunization: Secondary | ICD-10-CM | POA: Diagnosis not present

## 2021-04-29 DIAGNOSIS — Z23 Encounter for immunization: Secondary | ICD-10-CM | POA: Diagnosis not present

## 2021-05-24 DIAGNOSIS — Z23 Encounter for immunization: Secondary | ICD-10-CM | POA: Diagnosis not present

## 2021-05-24 DIAGNOSIS — L821 Other seborrheic keratosis: Secondary | ICD-10-CM | POA: Diagnosis not present

## 2021-05-24 DIAGNOSIS — L570 Actinic keratosis: Secondary | ICD-10-CM | POA: Diagnosis not present

## 2021-05-24 DIAGNOSIS — D1801 Hemangioma of skin and subcutaneous tissue: Secondary | ICD-10-CM | POA: Diagnosis not present

## 2021-07-04 DIAGNOSIS — U071 COVID-19: Secondary | ICD-10-CM | POA: Diagnosis not present

## 2021-07-16 DIAGNOSIS — Z20822 Contact with and (suspected) exposure to covid-19: Secondary | ICD-10-CM | POA: Diagnosis not present

## 2021-08-05 DIAGNOSIS — Z20822 Contact with and (suspected) exposure to covid-19: Secondary | ICD-10-CM | POA: Diagnosis not present

## 2021-08-08 DIAGNOSIS — Z1389 Encounter for screening for other disorder: Secondary | ICD-10-CM | POA: Diagnosis not present

## 2021-08-08 DIAGNOSIS — M810 Age-related osteoporosis without current pathological fracture: Secondary | ICD-10-CM | POA: Diagnosis not present

## 2021-08-08 DIAGNOSIS — Z0000 Encounter for general adult medical examination without abnormal findings: Secondary | ICD-10-CM | POA: Diagnosis not present

## 2021-08-08 DIAGNOSIS — Z1159 Encounter for screening for other viral diseases: Secondary | ICD-10-CM | POA: Diagnosis not present

## 2021-08-08 DIAGNOSIS — I10 Essential (primary) hypertension: Secondary | ICD-10-CM | POA: Diagnosis not present

## 2021-08-08 DIAGNOSIS — E7800 Pure hypercholesterolemia, unspecified: Secondary | ICD-10-CM | POA: Diagnosis not present

## 2021-08-29 ENCOUNTER — Other Ambulatory Visit: Payer: Self-pay | Admitting: Internal Medicine

## 2021-08-29 DIAGNOSIS — M810 Age-related osteoporosis without current pathological fracture: Secondary | ICD-10-CM

## 2021-09-05 DIAGNOSIS — Z20822 Contact with and (suspected) exposure to covid-19: Secondary | ICD-10-CM | POA: Diagnosis not present

## 2021-10-03 DIAGNOSIS — Z20822 Contact with and (suspected) exposure to covid-19: Secondary | ICD-10-CM | POA: Diagnosis not present

## 2021-11-03 DIAGNOSIS — Z20822 Contact with and (suspected) exposure to covid-19: Secondary | ICD-10-CM | POA: Diagnosis not present

## 2021-12-03 DIAGNOSIS — Z20822 Contact with and (suspected) exposure to covid-19: Secondary | ICD-10-CM | POA: Diagnosis not present

## 2021-12-12 DIAGNOSIS — Z23 Encounter for immunization: Secondary | ICD-10-CM | POA: Diagnosis not present

## 2022-02-06 ENCOUNTER — Ambulatory Visit
Admission: RE | Admit: 2022-02-06 | Discharge: 2022-02-06 | Disposition: A | Discharge status: Home / Self Care | Payer: Medicare Other | Source: Ambulatory Visit | Attending: Internal Medicine | Admitting: Internal Medicine

## 2022-02-06 DIAGNOSIS — M810 Age-related osteoporosis without current pathological fracture: Secondary | ICD-10-CM | POA: Diagnosis not present

## 2022-02-06 DIAGNOSIS — M85851 Other specified disorders of bone density and structure, right thigh: Secondary | ICD-10-CM | POA: Diagnosis not present

## 2022-02-06 DIAGNOSIS — Z780 Asymptomatic menopausal state: Secondary | ICD-10-CM | POA: Diagnosis not present

## 2022-02-19 DIAGNOSIS — M810 Age-related osteoporosis without current pathological fracture: Secondary | ICD-10-CM | POA: Diagnosis not present

## 2022-02-19 DIAGNOSIS — M170 Bilateral primary osteoarthritis of knee: Secondary | ICD-10-CM | POA: Diagnosis not present

## 2022-02-19 DIAGNOSIS — I10 Essential (primary) hypertension: Secondary | ICD-10-CM | POA: Diagnosis not present

## 2022-02-19 DIAGNOSIS — I493 Ventricular premature depolarization: Secondary | ICD-10-CM | POA: Diagnosis not present

## 2022-02-19 DIAGNOSIS — R54 Age-related physical debility: Secondary | ICD-10-CM | POA: Diagnosis not present

## 2022-02-19 DIAGNOSIS — M5412 Radiculopathy, cervical region: Secondary | ICD-10-CM | POA: Diagnosis not present

## 2022-02-22 DIAGNOSIS — H16223 Keratoconjunctivitis sicca, not specified as Sjogren's, bilateral: Secondary | ICD-10-CM | POA: Diagnosis not present

## 2022-02-22 DIAGNOSIS — H26491 Other secondary cataract, right eye: Secondary | ICD-10-CM | POA: Diagnosis not present

## 2022-02-22 DIAGNOSIS — Z961 Presence of intraocular lens: Secondary | ICD-10-CM | POA: Diagnosis not present

## 2022-03-12 ENCOUNTER — Other Ambulatory Visit: Payer: Self-pay | Admitting: Internal Medicine

## 2022-03-12 ENCOUNTER — Ambulatory Visit
Admission: RE | Admit: 2022-03-12 | Discharge: 2022-03-12 | Disposition: A | Discharge status: Home / Self Care | Payer: Medicare Other | Source: Ambulatory Visit | Attending: Internal Medicine | Admitting: Internal Medicine

## 2022-03-12 DIAGNOSIS — J45901 Unspecified asthma with (acute) exacerbation: Secondary | ICD-10-CM

## 2022-03-12 DIAGNOSIS — R059 Cough, unspecified: Secondary | ICD-10-CM | POA: Diagnosis not present

## 2022-04-29 DIAGNOSIS — Z23 Encounter for immunization: Secondary | ICD-10-CM | POA: Diagnosis not present

## 2022-05-03 DIAGNOSIS — R008 Other abnormalities of heart beat: Secondary | ICD-10-CM | POA: Diagnosis not present

## 2022-05-03 DIAGNOSIS — R053 Chronic cough: Secondary | ICD-10-CM | POA: Diagnosis not present

## 2022-05-27 DIAGNOSIS — R202 Paresthesia of skin: Secondary | ICD-10-CM | POA: Diagnosis not present

## 2022-05-27 DIAGNOSIS — L821 Other seborrheic keratosis: Secondary | ICD-10-CM | POA: Diagnosis not present

## 2022-05-27 DIAGNOSIS — D229 Melanocytic nevi, unspecified: Secondary | ICD-10-CM | POA: Diagnosis not present

## 2022-05-27 DIAGNOSIS — B078 Other viral warts: Secondary | ICD-10-CM | POA: Diagnosis not present

## 2022-05-27 DIAGNOSIS — L814 Other melanin hyperpigmentation: Secondary | ICD-10-CM | POA: Diagnosis not present

## 2022-05-27 DIAGNOSIS — L578 Other skin changes due to chronic exposure to nonionizing radiation: Secondary | ICD-10-CM | POA: Diagnosis not present

## 2022-05-28 DIAGNOSIS — M810 Age-related osteoporosis without current pathological fracture: Secondary | ICD-10-CM | POA: Diagnosis not present

## 2022-05-28 DIAGNOSIS — Z23 Encounter for immunization: Secondary | ICD-10-CM | POA: Diagnosis not present

## 2022-05-28 DIAGNOSIS — J45901 Unspecified asthma with (acute) exacerbation: Secondary | ICD-10-CM | POA: Diagnosis not present

## 2022-05-28 DIAGNOSIS — I10 Essential (primary) hypertension: Secondary | ICD-10-CM | POA: Diagnosis not present

## 2022-05-28 DIAGNOSIS — R54 Age-related physical debility: Secondary | ICD-10-CM | POA: Diagnosis not present

## 2022-06-18 DIAGNOSIS — R059 Cough, unspecified: Secondary | ICD-10-CM | POA: Diagnosis not present

## 2022-08-08 DIAGNOSIS — R053 Chronic cough: Secondary | ICD-10-CM | POA: Diagnosis not present

## 2022-09-11 DIAGNOSIS — R54 Age-related physical debility: Secondary | ICD-10-CM | POA: Diagnosis not present

## 2022-09-11 DIAGNOSIS — E7800 Pure hypercholesterolemia, unspecified: Secondary | ICD-10-CM | POA: Diagnosis not present

## 2022-09-11 DIAGNOSIS — I10 Essential (primary) hypertension: Secondary | ICD-10-CM | POA: Diagnosis not present

## 2022-09-11 DIAGNOSIS — M170 Bilateral primary osteoarthritis of knee: Secondary | ICD-10-CM | POA: Diagnosis not present

## 2022-09-11 DIAGNOSIS — Z1331 Encounter for screening for depression: Secondary | ICD-10-CM | POA: Diagnosis not present

## 2022-09-11 DIAGNOSIS — Z0000 Encounter for general adult medical examination without abnormal findings: Secondary | ICD-10-CM | POA: Diagnosis not present

## 2022-09-11 DIAGNOSIS — M810 Age-related osteoporosis without current pathological fracture: Secondary | ICD-10-CM | POA: Diagnosis not present

## 2022-10-04 DIAGNOSIS — J45901 Unspecified asthma with (acute) exacerbation: Secondary | ICD-10-CM | POA: Diagnosis not present

## 2022-10-04 DIAGNOSIS — R059 Cough, unspecified: Secondary | ICD-10-CM | POA: Diagnosis not present

## 2022-10-04 DIAGNOSIS — I10 Essential (primary) hypertension: Secondary | ICD-10-CM | POA: Diagnosis not present

## 2022-10-25 DIAGNOSIS — R059 Cough, unspecified: Secondary | ICD-10-CM | POA: Diagnosis not present

## 2022-11-19 ENCOUNTER — Other Ambulatory Visit: Payer: Self-pay

## 2022-11-19 ENCOUNTER — Ambulatory Visit (INDEPENDENT_AMBULATORY_CARE_PROVIDER_SITE_OTHER): Payer: Medicare Other | Admitting: Internal Medicine

## 2022-11-19 ENCOUNTER — Encounter: Payer: Self-pay | Admitting: Internal Medicine

## 2022-11-19 VITALS — BP 120/70 | HR 70 | Temp 97.7°F | Resp 20 | Ht 63.39 in | Wt 187.8 lb

## 2022-11-19 DIAGNOSIS — J45991 Cough variant asthma: Secondary | ICD-10-CM

## 2022-11-19 DIAGNOSIS — J343 Hypertrophy of nasal turbinates: Secondary | ICD-10-CM

## 2022-11-19 DIAGNOSIS — J3089 Other allergic rhinitis: Secondary | ICD-10-CM

## 2022-11-19 MED ORDER — BUDESONIDE-FORMOTEROL FUMARATE 160-4.5 MCG/ACT IN AERO: 2.0000 | INHALATION_SPRAY | Freq: Two times a day (BID) | RESPIRATORY_TRACT | 5 refills | Status: DC

## 2022-11-19 MED ORDER — IPRATROPIUM BROMIDE 0.06 % NA SOLN: 1.0000 | Freq: Four times a day (QID) | NASAL | 5 refills | Status: DC | PRN

## 2022-11-19 MED ORDER — FLUTICASONE PROPIONATE 50 MCG/ACT NA SUSP
2.0000 | Freq: Every day | NASAL | 5 refills | Status: DC
Start: 2022-11-19 — End: 2023-01-21

## 2022-11-19 MED ORDER — MONTELUKAST SODIUM 10 MG PO TABS: 10.0000 mg | ORAL_TABLET | Freq: Every day | ORAL | 5 refills | Status: DC

## 2022-11-19 NOTE — Progress Notes (Signed)
NEW PATIENT  Date of Service/Encounter:  11/19/22  Consult requested by: Kendrick Ranch, MD   Subjective:   Holly Larson (DOB: 07-05-1935) is a 88 y.o. female who presents to the clinic on 11/19/2022 with a chief complaint of Establish Care, Cough, and Allergy Testing .    History obtained from: chart review and patient.  Chronic Cough: No history of asthma as a child.  Does recall over the years, she has had to use albuterol though especially due to cough/bronchitis with respiratory illness.  However, about 1 year ago, she went to [place] and came back after with a persistent cough.  It is dry.  No SOB/wheezing.  She has seen ENT/PCP and has tried prednisone and PPI (lansoprazole) and would have some improvement but then the cough would come back.  About 2 months ago, she was started on Symbicort 2 puffs BID and since then the cough has resolved.  No albuterol use in over months.  Also on Singulair daily. Denies any issues with heartburn, sour taste in mouth, reflux.  Saw Dr. Chales Salmon 10/08/2022 for chronic cough.  On PPI and Symbicort.  Has dx of allergic bronchitis and chronic cough.    Saw ENT 08/08/2022 [facility] for chronic cough.  Did have resolution for 3-4 weeks but then resurfaced.  On Singlair, Flonase, Claritin. Mostly dry/nonproductive cough. Started on PPI/Pepcid and if no improvement, consider allergy referral.   05/03/2022: seen by [facility] for persistent cough, given prednisone, doxycycline and tessalon perles.   Rhinitis:  Started years ago.  Symptoms include: nasal congestion, rhinorrhea, and post nasal drainage  Occurs year-round Potential triggers: not sure Treatments tried:  Claritin PRN; last use was Thursday. Flonase PRN in the past  Previous allergy testing: no History of sinus surgery: no Nonallergic triggers: none    Past Medical History: Past Medical History:  Diagnosis Date   Arthritis    Gilbert's syndrome     Hypercholesterolemia    Hypertension    Osteoporosis     Past Surgical History: Past Surgical History:  Procedure Laterality Date   BREAST BIOPSY Left 12/15/2014   benign   PARATHYROIDECTOMY      Family History: Family History  Problem Relation Age of Onset   Arthritis Mother    Macular degeneration Mother    Diabetes Father    Glaucoma Father    Congestive Heart Failure Father    Breast cancer Granddaughter 30    Social History:  Lives in a 22 year house Flooring in bedroom: carpet Pets: none Tobacco use/exposure: [phone], 1ppd Job: retire   Medication List:  Allergies as of 11/19/2022   No Known Allergies      Medication List        Accurate as of November 19, 2022 11:57 AM. If you have any questions, ask your nurse or doctor.          albuterol 108 (90 Base) MCG/ACT inhaler Commonly known as: VENTOLIN HFA 2 inhalations TID prn cough for 2-3 weeks   b complex vitamins tablet Take 1 tablet by mouth daily.   budesonide-formoterol 160-4.5 MCG/ACT inhaler Commonly known as: SYMBICORT Inhale 2 puffs into the lungs daily.   Calcium Magnesium Zinc 333-133-5 MG Tabs Take 1 tablet by mouth 2 (two) times daily.   Cholecalciferol 25 MCG (1000 UT) Chew Chew 1 tablet by mouth daily.   CoQ10 100 MG Caps Take 100 mg by mouth daily.   [employer] VITAMIN B12 1000 MCG tablet Generic drug: cyanocobalamin Take  1,000 mcg by mouth daily.   hydrochlorothiazide 12.5 MG tablet Commonly known as: HYDRODIURIL Take 1 tablet (12.5 mg total) by mouth daily.   ibandronate 150 MG tablet Commonly known as: BONIVA Take 150 mg by mouth every 30 (thirty) days.   loratadine 10 MG tablet Commonly known as: CLARITIN Take 10 mg by mouth daily.   montelukast 10 MG tablet Commonly known as: SINGULAIR Take 10 mg by mouth at bedtime.   multivitamin capsule Take 1 capsule by mouth daily.   POTASSIUM GLUCONATE PO Take 90 mg by mouth daily.   predniSONE 20 MG tablet Commonly  known as: DELTASONE Take two tablets daily for 3 days then one tablet daily for 3 days then stop   PROBIOTIC DAILY PO Take by mouth.   Restasis 0.05 % ophthalmic emulsion Generic drug: cycloSPORINE   simvastatin 20 MG tablet Commonly known as: ZOCOR Take 1 tablet (20 mg total) by mouth daily.   TYLENOL PO Take by mouth as needed (pain).         REVIEW OF SYSTEMS: Pertinent positives and negatives discussed in HPI.   Objective:   Physical Exam: BP 120/70   Pulse 70   Temp 97.7 F (36.5 C) (Oral)   Resp 20   Ht 5' 3.39" (1.61 m)   Wt 187 lb 12.8 oz (85.2 kg)   SpO2 95%   BMI 32.86 kg/m  Body mass index is 32.86 kg/m. GEN: alert, well developed HEENT: clear conjunctiva, TM grey and translucent, nose with + inferior turbinate hypertrophy, pink nasal mucosa, no rhinorrhea, no cobblestoning HEART: regular rate and rhythm, no murmur LUNGS: clear to auscultation bilaterally, no coughing, unlabored respiration ABDOMEN: soft, non distended  SKIN: no rashes or lesions  Reviewed:  Saw Dr. Chales Salmon 10/08/2022 for chronic cough.  On PPI and Symbicort.  Has dx of allergic bronchitis and chronic cough.    Saw ENT 08/08/2022 [facility] for chronic cough.  Did have resolution for 3-4 weeks but then resurfaced.  On Singlair, Flonase, Claritin. Mostly dry/nonproductive cough. Started on PPI/Pepcid and if no improvement, consider allergy referral.   05/03/2022: seen by [facility] for persistent cough, given prednisone, doxycycline and tessalon perles.   Spirometry:  Tracings reviewed. Her effort: Good reproducible efforts. FVC: 2.87L FEV1: 2.13L, 122% predicted FEV1/FVC ratio: 74% Interpretation: Spirometry consistent with normal pattern.  Please see scanned spirometry results for details.  Skin Testing:  Skin prick testing was placed, which includes aeroallergens/foods, histamine control, and saline control.  Verbal consent was obtained prior to placing test.  Patient tolerated  procedure well.  Allergy testing results were read and interpreted by myself, documented by clinical staff. Adequate positive and negative control.  Results discussed with patient/family.  Airborne Adult Perc - 11/19/22 1027     Time Antigen Placed 1012    Allergen Manufacturer Waynette Buttery    Location Back    Number of Test 58    1. Control-Buffer 50% Glycerol Negative    2. Control-Histamine 1 mg/ml 3+    3. Albumin saline Negative    4. Bahia Negative    5. French Southern Territories Negative    6. Johnson Negative    7. Kentucky Blue Negative    9. Perennial Rye Negative    10. Sweet Vernal Negative    11. Timothy Negative    12. Cocklebur Negative    13. Burweed Marshelder Negative    14. Ragweed, short Negative    15. Ragweed, Giant Negative    16. Plantain,  English Negative  17. Lamb's Quarters Negative    18. Sheep Sorrell Negative    19. Rough Pigweed Negative    20. Marsh Elder, Rough Negative    21. Mugwort, Common Negative    22. Ash mix Negative    23. Birch mix Negative    24. Beech American Negative    25. Box, Elder Negative    26. Cedar, red Negative    27. Cottonwood, Guinea-Bissau Negative    28. Elm mix Negative    29. Hickory Negative    30. Maple mix Negative    31. Oak, Guinea-Bissau mix Negative    32. Pecan Pollen Negative    33. Pine mix Negative    34. Sycamore Eastern Negative    35. Walnut, Black Pollen Negative    36. Alternaria alternata Negative    37. Cladosporium Herbarum Negative    38. Aspergillus mix Negative    39. Penicillium mix Negative    40. Bipolaris sorokiniana (Helminthosporium) Negative    41. Drechslera spicifera (Curvularia) Negative    42. Mucor plumbeus Negative    43. Fusarium moniliforme Negative    44. Aureobasidium pullulans (pullulara) Negative    45. Rhizopus oryzae Negative    46. Botrytis cinera Negative    47. Epicoccum nigrum Negative    48. Phoma betae Negative    49. Candida Albicans Negative    50. Trichophyton mentagrophytes  Negative    51. Mite, D Farinae  5,000 AU/ml Negative    52. Mite, D Pteronyssinus  5,000 AU/ml Negative    53. Cat Hair 10,000 BAU/ml Negative    54.  Dog Epithelia Negative    55. Mixed Feathers Negative    56. Horse Epithelia Negative    57. Cockroach, German Negative    58. Mouse Negative    59. Tobacco Leaf Negative             Intradermal - 11/19/22 1046     Time Antigen Placed 1051    Allergen Manufacturer [place]    Location Arm    Number of Test 15    Control Negative    French Southern Territories Negative    Johnson Negative    7 Grass Negative    Ragweed mix Negative    Weed mix Negative    Tree mix Negative    Mold 1 Negative    Mold 2 Negative    Mold 3 Negative    Mold 4 Negative    Cat Negative    Dog Negative    Cockroach Negative    Mite mix Negative               Assessment:   1. Cough variant asthma   2. Nasal turbinate hypertrophy   3. Other allergic rhinitis     Plan/Recommendations:  Cough Variant Asthma: - Normal spirometry today.  Dry cough responded to Symbicort.   - Maintenance inhaler: continue Singulair  daily and Symbicort 2 puffs twice daily.  Brush and gargle after use.  - Rescue inhaler: Albuterol 2 puffs via spacer or 1 vial via nebulizer every 4-6 hours as needed for respiratory symptoms of cough, shortness of breath, or wheezing Asthma control goals:  Full participation in all desired activities (may need albuterol before activity) Albuterol use two times or less a week on average (not counting use with activity) Cough interfering with sleep two times or less a month Oral steroids no more than once a year No hospitalizations   Allergic Rhinitis: - Due to  turbinate hypertrophy and unresponsive to OTC meds, performed skin testing to identify aeroallergen triggers.   - Positive skin test 11/2022: none - Use nasal saline rinses before nose sprays such as with Neilmed Sinus Rinse.  Use distilled water.   - Use Flonase 2 sprays  each nostril daily. Aim upward and outward. - Use Ipratroprium 1-2 sprays up to three times daily as needed for runny nose. Aim upward and outward. - Okay to stop Claritin.   - Use Singulair  daily.  Black box warning discussed.  Stop if there are any mood/behavioral changes.      Return in about 6 weeks (around 12/31/2022).  Alesia Morin, MD [facility]

## 2022-11-19 NOTE — Patient Instructions (Addendum)
Cough Variant Asthma: - Maintenance inhaler: continue Singulair  daily and Symbicort 2 puffs twice daily.  Brush and gargle after use.  - Rescue inhaler: Albuterol 2 puffs every 4-6 hours as needed for respiratory symptoms of cough, shortness of breath, or wheezing Asthma control goals:  Full participation in all desired activities (may need albuterol before activity) Albuterol use two times or less a week on average (not counting use with activity) Cough interfering with sleep two times or less a month Oral steroids no more than once a year No hospitalizations   Allergic Rhinitis: - Positive skin test 11/2022: none - Use nasal saline rinses before nose sprays such as with Neilmed Sinus Rinse.  Use distilled water.   - Use Flonase 2 sprays each nostril daily. Aim upward and outward. - Use Ipratroprium 1-2 sprays up to three times daily as needed for runny nose. Aim upward and outward. - Okay to stop Claritin.   - Use Singulair  daily.  Black box warning discussed.  Stop if there are any mood/behavioral changes.

## 2023-01-21 ENCOUNTER — Ambulatory Visit (INDEPENDENT_AMBULATORY_CARE_PROVIDER_SITE_OTHER): Payer: Medicare Other | Admitting: Internal Medicine

## 2023-01-21 ENCOUNTER — Encounter: Payer: Self-pay | Admitting: Internal Medicine

## 2023-01-21 ENCOUNTER — Other Ambulatory Visit: Payer: Self-pay

## 2023-01-21 VITALS — BP 144/82 | HR 74 | Temp 98.0°F | Ht 63.39 in | Wt 185.1 lb

## 2023-01-21 DIAGNOSIS — J310 Chronic rhinitis: Secondary | ICD-10-CM

## 2023-01-21 DIAGNOSIS — J45991 Cough variant asthma: Secondary | ICD-10-CM

## 2023-01-21 MED ORDER — IPRATROPIUM BROMIDE 0.06 % NA SOLN: 1.0000 | Freq: Four times a day (QID) | NASAL | 5 refills | Status: DC | PRN

## 2023-01-21 MED ORDER — FLUTICASONE PROPIONATE 50 MCG/ACT NA SUSP: 2.0000 | Freq: Every day | NASAL | 5 refills | Status: DC

## 2023-01-21 MED ORDER — MONTELUKAST SODIUM 10 MG PO TABS: 10.0000 mg | ORAL_TABLET | Freq: Every day | ORAL | 5 refills | Status: AC

## 2023-01-21 MED ORDER — BUDESONIDE-FORMOTEROL FUMARATE 160-4.5 MCG/ACT IN AERO: 2.0000 | INHALATION_SPRAY | Freq: Every day | RESPIRATORY_TRACT | 5 refills | Status: DC

## 2023-01-21 MED ORDER — ALBUTEROL SULFATE HFA 108 (90 BASE) MCG/ACT IN AERS: 1.0000 | INHALATION_SPRAY | Freq: Four times a day (QID) | RESPIRATORY_TRACT | 0 refills | Status: DC | PRN

## 2023-01-21 NOTE — Progress Notes (Signed)
   FOLLOW UP Date of Service/Encounter:  01/21/23   Subjective:  Holly Larson (DOB: [birth year]) is a 88 y.o. female who [facility] for follow up for cough variant asthma and chronic rhinitis.   History obtained from: chart review and patient. Last visit 11/19/2022, responded to Symbicort/Singulair with normal spirometry so discussed continuing it. SPT was negative so started on Flonase/PRN Ipratroprium/stop Claritin.   Since last visit, she has not had any issues with cough. She is very happy with the progress.  There was a short time when she forgot her Symbicort on vacation and did okay; since then, has reduced it to 2 puffs daily. Also taking Singulair.  No albuterol use since last visit. Has not had to go to the ER/urgent care or needed oral prednisone since last visit.   Not much runny nose or drainage either with daily Flonase. Stopped Claritin as discussed and did not notice a difference.  Rarely needs Ipratropium and only if she has a lot of drainage.   Past Medical History: Past Medical History:  Diagnosis Date   Arthritis    Gilbert's syndrome    Hypercholesterolemia    Hypertension    Osteoporosis     Objective:  BP (!) 144/82   Pulse 74   Temp 98 F (36.7 C) (Temporal)   Ht 5' 3.39" (1.61 m)   Wt 185 lb 1.6 oz (84 kg)   SpO2 94%   BMI 32.39 kg/m  Body mass index is 32.39 kg/m. Physical Exam: GEN: alert, well developed HEENT: clear conjunctiva, TM grey and translucent, nose with mild inferior turbinate hypertrophy, pink nasal mucosa, no rhinorrhea, no cobblestoning HEART: regular rate and rhythm, no murmur LUNGS: clear to auscultation bilaterally, no coughing, unlabored respiration SKIN: no rashes or lesions  Spirometry:  Tracings reviewed. Her effort: Good reproducible efforts. FVC: 3.01L FEV1: 2.18L, 125% predicted FEV1/FVC ratio: 72% Interpretation: Spirometry consistent with normal pattern.  Please see  scanned spirometry results for details.  Assessment:   1. Cough variant asthma   2. Chronic rhinitis     Plan/Recommendations:  Cough Variant Asthma: - Controlled, will consider stopping Symbicort depending on how she does through Fall/Winter. Normal spirometry today.  - Maintenance inhaler: continue Singulair 10mg  daily and Symbicort 2 puffs daily.  Brush and gargle after use.  - Rescue inhaler: Albuterol 2 puffs every 4-6 hours as needed for respiratory symptoms of cough, shortness of breath, or wheezing Asthma control goals:  Full participation in all desired activities (may need albuterol before activity) Albuterol use two times or less a week on average (not counting use with activity) Cough interfering with sleep two times or less a month Oral steroids no more than once a year No hospitalizations  Chronic Rhinitis: - Controlled - Positive skin test 11/2022: none - Use nasal saline rinses before nose sprays such as with Neilmed Sinus Rinse.  Use distilled water.   - Use Flonase 2 sprays each nostril daily. Aim upward and outward. - Use Ipratroprium 1-2 sprays up to three times daily as needed for runny nose. Aim upward and outward.      Return in about 4 months (around 05/23/2023).  Alesia Morin, MD [facility]

## 2023-01-21 NOTE — Patient Instructions (Addendum)
Cough Variant Asthma: - Maintenance inhaler: continue Singulair 10mg  daily and Symbicort 2 puffs daily.  Brush and gargle after use.  - Rescue inhaler: Albuterol 2 puffs every 4-6 hours as needed for respiratory symptoms of cough, shortness of breath, or wheezing Asthma control goals:  Full participation in all desired activities (may need albuterol before activity) Albuterol use two times or less a week on average (not counting use with activity) Cough interfering with sleep two times or less a month Oral steroids no more than once a year No hospitalizations  Chronic Rhinitis: - Positive skin test 11/2022: none - Use nasal saline rinses before nose sprays such as with Neilmed Sinus Rinse.  Use distilled water.   - Use Flonase 2 sprays each nostril daily. Aim upward and outward. - Use Ipratroprium 1-2 sprays up to three times daily as needed for runny nose. Aim upward and outward.

## 2023-04-01 DIAGNOSIS — J45909 Unspecified asthma, uncomplicated: Secondary | ICD-10-CM | POA: Diagnosis not present

## 2023-04-01 DIAGNOSIS — I10 Essential (primary) hypertension: Secondary | ICD-10-CM | POA: Diagnosis not present

## 2023-04-01 DIAGNOSIS — M810 Age-related osteoporosis without current pathological fracture: Secondary | ICD-10-CM | POA: Diagnosis not present

## 2023-04-05 DIAGNOSIS — Z23 Encounter for immunization: Secondary | ICD-10-CM | POA: Diagnosis not present

## 2023-04-08 DIAGNOSIS — H16223 Keratoconjunctivitis sicca, not specified as Sjogren's, bilateral: Secondary | ICD-10-CM | POA: Diagnosis not present

## 2023-04-08 DIAGNOSIS — Z961 Presence of intraocular lens: Secondary | ICD-10-CM | POA: Diagnosis not present

## 2023-04-08 DIAGNOSIS — D23122 Other benign neoplasm of skin of left lower eyelid, including canthus: Secondary | ICD-10-CM | POA: Diagnosis not present

## 2023-04-23 DIAGNOSIS — Z23 Encounter for immunization: Secondary | ICD-10-CM | POA: Diagnosis not present

## 2023-05-23 ENCOUNTER — Encounter: Payer: Self-pay | Admitting: Internal Medicine

## 2023-05-23 ENCOUNTER — Ambulatory Visit (INDEPENDENT_AMBULATORY_CARE_PROVIDER_SITE_OTHER): Payer: Medicare Other | Admitting: Internal Medicine

## 2023-05-23 ENCOUNTER — Other Ambulatory Visit: Payer: Self-pay

## 2023-05-23 VITALS — BP 114/60 | HR 65 | Temp 98.0°F | Resp 16 | Wt 186.9 lb

## 2023-05-23 DIAGNOSIS — J45991 Cough variant asthma: Secondary | ICD-10-CM | POA: Diagnosis not present

## 2023-05-23 DIAGNOSIS — J310 Chronic rhinitis: Secondary | ICD-10-CM | POA: Diagnosis not present

## 2023-05-23 MED ORDER — IPRATROPIUM BROMIDE 0.06 % NA SOLN: 1.0000 | Freq: Four times a day (QID) | NASAL | 5 refills | Status: DC | PRN

## 2023-05-23 MED ORDER — BUDESONIDE-FORMOTEROL FUMARATE 160-4.5 MCG/ACT IN AERO: INHALATION_SPRAY | RESPIRATORY_TRACT | 5 refills | Status: DC

## 2023-05-23 MED ORDER — FLUTICASONE PROPIONATE 50 MCG/ACT NA SUSP: 2.0000 | Freq: Every day | NASAL | 5 refills | Status: DC

## 2023-05-23 NOTE — Progress Notes (Addendum)
   FOLLOW UP Date of Service/Encounter:  05/23/23   Subjective:  Holly Larson (DOB: 1935-04-06) is a 88 y.o. female who [facility] for follow up for cough variant asthma and chronic rhinitis.   History obtained from: chart review and patient. Last visit was on 01/21/2023 with me; doing well on Singulair and Symbicort.  Had discussed possible discontinuation of inhaler depending on how she does. Also on Flonase/Ipratropium for rhinitis.   Asthma Control Test: ACT Total Score: 23.    Doing well since last visit. Has not had much issues with cough. Does sometimes have post nasal drip/throat clearing.  No SOB/wheezing.  Taking Symbicort, Singulair, Flonase.  Has not used Ipratropium much.  No ER visits/oral prednisone use since last visit.   Past Medical History: Past Medical History:  Diagnosis Date   Arthritis    Gilbert's syndrome    Hypercholesterolemia    Hypertension    Osteoporosis     Objective:  BP 114/60   Pulse 65   Temp 98 F (36.7 C) (Temporal)   Resp 16   Wt 186 lb 14.4 oz (84.8 kg)   SpO2 94%   BMI 32.71 kg/m  Body mass index is 32.71 kg/m. Physical Exam: GEN: alert, well developed HEENT: clear conjunctiva, TM grey and translucent, nose with mild inferior turbinate hypertrophy, pink nasal mucosa, no rhinorrhea, slight cobblestoning HEART: regular rate and rhythm, no murmur LUNGS: clear to auscultation bilaterally, no coughing, unlabored respiration SKIN: no rashes or lesions  Assessment:   No diagnosis found.  Plan/Recommendations:  Cough Variant Asthma: - Normal spirometry in the past but responded to ICS/LABA.  Well controlled so will de-escalate therpay. MDI teaching done.  - Maintenance inhaler: stop Singulair and let's switch Symbicort to as needed.   - Rescue inhaler: use Symbicort 160-4.60mcg 2 puffs as needed.  Maximum 12 puffs in a day. Brush and Gargle after use.  Asthma control goals:  Full  participation in all desired activities (may need albuterol before activity) Cough interfering with sleep two times or less a month Oral steroids no more than once a year No hospitalizations  Chronic Rhinitis: - Uncontrolled, discussed adding Ipratropium.  - Positive skin test 11/2022: none - Use nasal saline rinses before nose sprays such as with Neilmed Sinus Rinse.  Use distilled water.   - Use Flonase 2 sprays each nostril daily. Aim upward and outward. - Use Ipratroprium 1-2 sprays up to three times daily as needed for runny nose. Aim upward and outward.     Return in about 3 months (around 08/23/2023).  Alesia Morin, MD [facility]

## 2023-05-23 NOTE — Patient Instructions (Addendum)
Cough Variant Asthma: - Maintenance inhaler: stop Singulair and let's switch Symbicort to as needed.   - Rescue inhaler: use Symbicort 160-4.49mcg 2 puffs as needed.  Maximum 12 puffs in a day. Brush and Gargle after use.  Asthma control goals:  Full participation in all desired activities (may need albuterol before activity) Cough interfering with sleep two times or less a month Oral steroids no more than once a year No hospitalizations  Chronic Rhinitis: - Positive skin test 11/2022: none - Use nasal saline rinses before nose sprays such as with Neilmed Sinus Rinse.  Use distilled water.   - Use Flonase 2 sprays each nostril daily. Aim upward and outward. - Use Ipratroprium 1-2 sprays up to three times daily as needed for runny nose. Aim upward and outward.

## 2023-05-28 DIAGNOSIS — L578 Other skin changes due to chronic exposure to nonionizing radiation: Secondary | ICD-10-CM | POA: Diagnosis not present

## 2023-05-28 DIAGNOSIS — L814 Other melanin hyperpigmentation: Secondary | ICD-10-CM | POA: Diagnosis not present

## 2023-05-28 DIAGNOSIS — L821 Other seborrheic keratosis: Secondary | ICD-10-CM | POA: Diagnosis not present

## 2023-05-28 DIAGNOSIS — D229 Melanocytic nevi, unspecified: Secondary | ICD-10-CM | POA: Diagnosis not present

## 2023-07-17 ENCOUNTER — Other Ambulatory Visit: Payer: Self-pay | Admitting: Medical Genetics

## 2023-08-12 ENCOUNTER — Ambulatory Visit: Payer: Medicare Other | Admitting: Internal Medicine

## 2023-08-12 ENCOUNTER — Encounter: Payer: Self-pay | Admitting: Internal Medicine

## 2023-08-12 ENCOUNTER — Other Ambulatory Visit: Payer: Self-pay

## 2023-08-12 VITALS — BP 112/80 | HR 56 | Temp 98.1°F | Wt 187.6 lb

## 2023-08-12 DIAGNOSIS — J310 Chronic rhinitis: Secondary | ICD-10-CM | POA: Diagnosis not present

## 2023-08-12 DIAGNOSIS — J45991 Cough variant asthma: Secondary | ICD-10-CM | POA: Diagnosis not present

## 2023-08-12 MED ORDER — IPRATROPIUM BROMIDE 0.06 % NA SOLN: 1.0000 | Freq: Three times a day (TID) | NASAL | 11 refills | Status: AC | PRN

## 2023-08-12 MED ORDER — FLUTICASONE PROPIONATE 50 MCG/ACT NA SUSP: 2.0000 | Freq: Every day | NASAL | 11 refills | Status: AC

## 2023-08-12 NOTE — Progress Notes (Signed)
   FOLLOW UP Date of Service/Encounter:  08/12/23   Subjective:  Holly Larson (DOB: [birth year]) is a 89 y.o. female who [facility] for follow up for cough variant asthma and chronic rhinitis.   History obtained from: chart review and patient. Last visit was with me on 05/23/2023 and was doing very well at the time so we discussed stopping Singulair  and using Symbicort  PRN.  Also on Flonase /Ipratropium nose spray.  Asthma Control Test: ACT Total Score: 25.    Since last visit, reports doing very well.  No trouble with chronic cough/dyspnea/wheezing.  Has not needed her Symbicort  at all.  No ER visits/oral prednisone .   Rhinitis is doing well also.  Sometimes has drainage and congestion but it is controlled when she uses the Flonase .  Rarely needs the Ipratropium.   She wishes to follow up PRN going forward if her symptoms worsen.   Past Medical History: Past Medical History:  Diagnosis Date   Arthritis    Gilbert's syndrome    Hypercholesterolemia    Hypertension    Osteoporosis     Objective:  BP 112/80 (BP Location: Left Arm, Patient Position: Sitting, Cuff Size: Normal)   Pulse (!) 56   Temp 98.1 F (36.7 C) (Temporal)   Wt 187 lb 9.6 oz (85.1 kg)   SpO2 97%   BMI 32.83 kg/m  Body mass index is 32.83 kg/m. Physical Exam: GEN: alert, well developed HEENT: clear conjunctiva, nose with mild inferior turbinate hypertrophy, pink nasal mucosa,no rhinorrhea, no cobblestoning HEART: regular rate and rhythm, no murmur LUNGS: clear to auscultation bilaterally, no coughing, unlabored respiration SKIN: no rashes or lesions   Spirometry:  Tracings reviewed. Her effort: Good reproducible efforts. FVC: 2.67L, 117% predicted  FEV1: 2.02L, 117% predicted FEV1/FVC ratio: 76% Interpretation: Spirometry consistent with normal pattern.  Please see scanned spirometry results for details.   Assessment:   1. Cough variant asthma      Plan/Recommendations:   Cough Variant Asthma: - Previously responded to ICS/LABA with normal spirometry.  Symptoms have resolved.  If symptoms recur, can restart PRN Symbicort  with PCP or follow up with us . Per pt, will switch to PRN follow up.   Chronic Rhinitis: - Well controlled.  - Positive skin test 11/2022: none - Use nasal saline rinses before nose sprays such as with Neilmed Sinus Rinse.  Use distilled water.   - Use Flonase  2 sprays each nostril daily. Aim upward and outward. - Use Ipratroprium 1-2 sprays up to three times daily as needed for runny nose. Aim upward and outward.    Return if symptoms worsen or fail to improve.  Arleta Blanch, MD [facility] [place]

## 2023-08-12 NOTE — Patient Instructions (Addendum)
 Cough Variant Asthma: - Previously responded to ICS/LABA with normal spirometry.  Symptoms have resolved.  If symptoms recur, can restart as needed Symbicort  with PCP or follow back up with us .   Chronic Rhinitis: - Positive skin test 11/2022: none - Use nasal saline rinses before nose sprays such as with Neilmed Sinus Rinse.  Use distilled water.   - Use Flonase  2 sprays each nostril daily. Aim upward and outward. - Use Ipratroprium 1-2 sprays up to three times daily as needed for runny nose. Aim upward and outward.

## 2023-08-14 ENCOUNTER — Other Ambulatory Visit (HOSPITAL_COMMUNITY)
Admission: RE | Admit: 2023-08-14 | Discharge: 2023-08-14 | Disposition: A | Discharge status: Home / Self Care | Payer: Self-pay | Source: Ambulatory Visit | Attending: Medical Genetics | Admitting: Medical Genetics

## 2023-08-25 LAB — GENECONNECT MOLECULAR SCREEN: Genetic Analysis Overall Interpretation: NEGATIVE

## 2023-09-17 ENCOUNTER — Other Ambulatory Visit: Payer: Self-pay | Admitting: Internal Medicine

## 2023-09-17 DIAGNOSIS — I10 Essential (primary) hypertension: Secondary | ICD-10-CM | POA: Diagnosis not present

## 2023-09-17 DIAGNOSIS — Z0000 Encounter for general adult medical examination without abnormal findings: Secondary | ICD-10-CM | POA: Diagnosis not present

## 2023-09-17 DIAGNOSIS — M810 Age-related osteoporosis without current pathological fracture: Secondary | ICD-10-CM

## 2023-09-17 DIAGNOSIS — I493 Ventricular premature depolarization: Secondary | ICD-10-CM | POA: Diagnosis not present

## 2023-09-17 DIAGNOSIS — M1990 Unspecified osteoarthritis, unspecified site: Secondary | ICD-10-CM | POA: Diagnosis not present

## 2023-09-17 DIAGNOSIS — R54 Age-related physical debility: Secondary | ICD-10-CM | POA: Diagnosis not present

## 2023-09-17 DIAGNOSIS — Z1331 Encounter for screening for depression: Secondary | ICD-10-CM | POA: Diagnosis not present

## 2023-09-17 DIAGNOSIS — E7800 Pure hypercholesterolemia, unspecified: Secondary | ICD-10-CM | POA: Diagnosis not present

## 2023-10-02 DIAGNOSIS — H16223 Keratoconjunctivitis sicca, not specified as Sjogren's, bilateral: Secondary | ICD-10-CM | POA: Diagnosis not present

## 2023-10-02 DIAGNOSIS — Z961 Presence of intraocular lens: Secondary | ICD-10-CM | POA: Diagnosis not present

## 2023-11-18 DIAGNOSIS — I10 Essential (primary) hypertension: Secondary | ICD-10-CM | POA: Diagnosis not present

## 2023-12-03 DIAGNOSIS — J45901 Unspecified asthma with (acute) exacerbation: Secondary | ICD-10-CM | POA: Diagnosis not present

## 2023-12-03 DIAGNOSIS — J45909 Unspecified asthma, uncomplicated: Secondary | ICD-10-CM | POA: Diagnosis not present

## 2023-12-03 DIAGNOSIS — M170 Bilateral primary osteoarthritis of knee: Secondary | ICD-10-CM | POA: Diagnosis not present

## 2023-12-03 DIAGNOSIS — I10 Essential (primary) hypertension: Secondary | ICD-10-CM | POA: Diagnosis not present

## 2023-12-17 DIAGNOSIS — I10 Essential (primary) hypertension: Secondary | ICD-10-CM | POA: Diagnosis not present

## 2023-12-23 DIAGNOSIS — H40012 Open angle with borderline findings, low risk, left eye: Secondary | ICD-10-CM | POA: Diagnosis not present

## 2023-12-23 DIAGNOSIS — H538 Other visual disturbances: Secondary | ICD-10-CM | POA: Diagnosis not present

## 2023-12-23 DIAGNOSIS — H16223 Keratoconjunctivitis sicca, not specified as Sjogren's, bilateral: Secondary | ICD-10-CM | POA: Diagnosis not present

## 2023-12-23 DIAGNOSIS — H26492 Other secondary cataract, left eye: Secondary | ICD-10-CM | POA: Diagnosis not present

## 2024-01-03 DIAGNOSIS — J45909 Unspecified asthma, uncomplicated: Secondary | ICD-10-CM | POA: Diagnosis not present

## 2024-01-03 DIAGNOSIS — I10 Essential (primary) hypertension: Secondary | ICD-10-CM | POA: Diagnosis not present

## 2024-01-03 DIAGNOSIS — J45901 Unspecified asthma with (acute) exacerbation: Secondary | ICD-10-CM | POA: Diagnosis not present

## 2024-01-03 DIAGNOSIS — M170 Bilateral primary osteoarthritis of knee: Secondary | ICD-10-CM | POA: Diagnosis not present

## 2024-01-09 DIAGNOSIS — Z23 Encounter for immunization: Secondary | ICD-10-CM | POA: Diagnosis not present

## 2024-01-16 DIAGNOSIS — I10 Essential (primary) hypertension: Secondary | ICD-10-CM | POA: Diagnosis not present

## 2024-01-30 DIAGNOSIS — H538 Other visual disturbances: Secondary | ICD-10-CM | POA: Diagnosis not present

## 2024-02-02 DIAGNOSIS — M170 Bilateral primary osteoarthritis of knee: Secondary | ICD-10-CM | POA: Diagnosis not present

## 2024-02-02 DIAGNOSIS — I10 Essential (primary) hypertension: Secondary | ICD-10-CM | POA: Diagnosis not present

## 2024-02-02 DIAGNOSIS — J45901 Unspecified asthma with (acute) exacerbation: Secondary | ICD-10-CM | POA: Diagnosis not present

## 2024-02-02 DIAGNOSIS — J45909 Unspecified asthma, uncomplicated: Secondary | ICD-10-CM | POA: Diagnosis not present

## 2024-02-13 DIAGNOSIS — H53452 Other localized visual field defect, left eye: Secondary | ICD-10-CM | POA: Diagnosis not present

## 2024-02-13 DIAGNOSIS — I69912 Visuospatial deficit and spatial neglect following unspecified cerebrovascular disease: Secondary | ICD-10-CM | POA: Diagnosis not present

## 2024-02-13 DIAGNOSIS — H40011 Open angle with borderline findings, low risk, right eye: Secondary | ICD-10-CM | POA: Diagnosis not present

## 2024-02-15 DIAGNOSIS — I10 Essential (primary) hypertension: Secondary | ICD-10-CM | POA: Diagnosis not present

## 2024-03-04 DIAGNOSIS — I10 Essential (primary) hypertension: Secondary | ICD-10-CM | POA: Diagnosis not present

## 2024-03-04 DIAGNOSIS — J45901 Unspecified asthma with (acute) exacerbation: Secondary | ICD-10-CM | POA: Diagnosis not present

## 2024-03-04 DIAGNOSIS — M170 Bilateral primary osteoarthritis of knee: Secondary | ICD-10-CM | POA: Diagnosis not present

## 2024-03-04 DIAGNOSIS — J45909 Unspecified asthma, uncomplicated: Secondary | ICD-10-CM | POA: Diagnosis not present

## 2024-03-16 DIAGNOSIS — I10 Essential (primary) hypertension: Secondary | ICD-10-CM | POA: Diagnosis not present

## 2024-03-17 ENCOUNTER — Ambulatory Visit (HOSPITAL_BASED_OUTPATIENT_CLINIC_OR_DEPARTMENT_OTHER)
Admission: RE | Admit: 2024-03-17 | Discharge: 2024-03-17 | Disposition: A | Discharge status: Home / Self Care | Source: Ambulatory Visit | Attending: Internal Medicine | Admitting: Internal Medicine

## 2024-03-17 DIAGNOSIS — M810 Age-related osteoporosis without current pathological fracture: Secondary | ICD-10-CM | POA: Diagnosis not present

## 2024-03-17 DIAGNOSIS — Z780 Asymptomatic menopausal state: Secondary | ICD-10-CM | POA: Diagnosis not present

## 2024-03-23 ENCOUNTER — Other Ambulatory Visit (HOSPITAL_BASED_OUTPATIENT_CLINIC_OR_DEPARTMENT_OTHER): Payer: Self-pay | Admitting: Internal Medicine

## 2024-03-23 DIAGNOSIS — H5340 Unspecified visual field defects: Secondary | ICD-10-CM | POA: Diagnosis not present

## 2024-03-23 DIAGNOSIS — M810 Age-related osteoporosis without current pathological fracture: Secondary | ICD-10-CM | POA: Diagnosis not present

## 2024-03-23 DIAGNOSIS — I10 Essential (primary) hypertension: Secondary | ICD-10-CM | POA: Diagnosis not present

## 2024-03-23 DIAGNOSIS — Z23 Encounter for immunization: Secondary | ICD-10-CM | POA: Diagnosis not present

## 2024-03-24 DIAGNOSIS — D485 Neoplasm of uncertain behavior of skin: Secondary | ICD-10-CM | POA: Diagnosis not present

## 2024-03-24 DIAGNOSIS — L570 Actinic keratosis: Secondary | ICD-10-CM | POA: Diagnosis not present

## 2024-03-24 DIAGNOSIS — L859 Epidermal thickening, unspecified: Secondary | ICD-10-CM | POA: Diagnosis not present

## 2024-03-30 ENCOUNTER — Ambulatory Visit (HOSPITAL_BASED_OUTPATIENT_CLINIC_OR_DEPARTMENT_OTHER)
Admission: RE | Admit: 2024-03-30 | Discharge: 2024-03-30 | Disposition: A | Discharge status: Home / Self Care | Source: Ambulatory Visit | Attending: Internal Medicine | Admitting: Internal Medicine

## 2024-03-30 DIAGNOSIS — I6521 Occlusion and stenosis of right carotid artery: Secondary | ICD-10-CM | POA: Diagnosis not present

## 2024-03-30 DIAGNOSIS — H5340 Unspecified visual field defects: Secondary | ICD-10-CM | POA: Diagnosis not present

## 2024-03-30 DIAGNOSIS — I6782 Cerebral ischemia: Secondary | ICD-10-CM | POA: Diagnosis not present

## 2024-05-11 DIAGNOSIS — Z23 Encounter for immunization: Secondary | ICD-10-CM | POA: Diagnosis not present

## 2024-05-13 ENCOUNTER — Other Ambulatory Visit: Payer: Medicare Other

## 2024-05-25 DIAGNOSIS — D229 Melanocytic nevi, unspecified: Secondary | ICD-10-CM | POA: Diagnosis not present

## 2024-05-25 DIAGNOSIS — I10 Essential (primary) hypertension: Secondary | ICD-10-CM | POA: Diagnosis not present

## 2024-05-25 DIAGNOSIS — L578 Other skin changes due to chronic exposure to nonionizing radiation: Secondary | ICD-10-CM | POA: Diagnosis not present

## 2024-05-25 DIAGNOSIS — L821 Other seborrheic keratosis: Secondary | ICD-10-CM | POA: Diagnosis not present

## 2024-05-25 DIAGNOSIS — E7800 Pure hypercholesterolemia, unspecified: Secondary | ICD-10-CM | POA: Diagnosis not present

## 2024-05-25 DIAGNOSIS — L814 Other melanin hyperpigmentation: Secondary | ICD-10-CM | POA: Diagnosis not present

## 2024-05-25 DIAGNOSIS — D1801 Hemangioma of skin and subcutaneous tissue: Secondary | ICD-10-CM | POA: Diagnosis not present

## 2024-05-25 DIAGNOSIS — H5340 Unspecified visual field defects: Secondary | ICD-10-CM | POA: Diagnosis not present

## 2024-05-25 DIAGNOSIS — L570 Actinic keratosis: Secondary | ICD-10-CM | POA: Diagnosis not present
